# Patient Record
Sex: Female | Born: 1995 | Race: Black or African American | Hispanic: No | Marital: Single | Smoking: Never smoker
Health system: Southern US, Community
[De-identification: ages and names within clinical notes are randomized; demographics above are authoritative.]

## PROBLEM LIST (undated history)

## (undated) ENCOUNTER — Inpatient Hospital Stay (HOSPITAL_COMMUNITY): Payer: Self-pay

## (undated) DIAGNOSIS — A64 Unspecified sexually transmitted disease: Secondary | ICD-10-CM

## (undated) DIAGNOSIS — N926 Irregular menstruation, unspecified: Secondary | ICD-10-CM

## (undated) DIAGNOSIS — Z8619 Personal history of other infectious and parasitic diseases: Secondary | ICD-10-CM

## (undated) HISTORY — DX: Personal history of other infectious and parasitic diseases: Z86.19

## (undated) HISTORY — PX: NO PAST SURGERIES: SHX2092

## (undated) HISTORY — DX: Irregular menstruation, unspecified: N92.6

---
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
## 2014-01-31 ENCOUNTER — Encounter (HOSPITAL_COMMUNITY): Payer: Self-pay | Admitting: Emergency Medicine

## 2014-01-31 ENCOUNTER — Emergency Department (HOSPITAL_COMMUNITY)
Admission: EM | Admit: 2014-01-31 | Discharge: 2014-02-01 | Disposition: A | Discharge status: Home / Self Care | Payer: No Typology Code available for payment source | Attending: Emergency Medicine | Admitting: Emergency Medicine

## 2014-01-31 DIAGNOSIS — Z8742 Personal history of other diseases of the female genital tract: Secondary | ICD-10-CM | POA: Insufficient documentation

## 2014-01-31 DIAGNOSIS — Z8619 Personal history of other infectious and parasitic diseases: Secondary | ICD-10-CM | POA: Insufficient documentation

## 2014-01-31 DIAGNOSIS — R21 Rash and other nonspecific skin eruption: Secondary | ICD-10-CM | POA: Insufficient documentation

## 2014-01-31 NOTE — ED Notes (Signed)
Pt. reports itchy rashes at abdomen /back and arms today , respirations unlabored/airway intact .

## 2014-02-01 MED ORDER — FAMOTIDINE 20 MG PO TABS
20.0000 mg | ORAL_TABLET | Freq: Once | ORAL | Status: AC
  Administered 2014-02-01: 20 mg via ORAL
  Filled 2014-02-01: qty 1

## 2014-02-01 MED ORDER — PERMETHRIN 5 % EX CREA: TOPICAL_CREAM | CUTANEOUS | Status: DC

## 2014-02-01 MED ORDER — HYDROCORTISONE 1 % EX CREA: 1.0000 "application " | TOPICAL_CREAM | Freq: Two times a day (BID) | CUTANEOUS | Status: DC

## 2014-02-01 MED ORDER — HYDROXYZINE HCL 25 MG PO TABS: 25.0000 mg | ORAL_TABLET | Freq: Four times a day (QID) | ORAL | Status: DC

## 2014-02-01 MED ORDER — FAMOTIDINE 20 MG PO TABS: 20.0000 mg | ORAL_TABLET | Freq: Two times a day (BID) | ORAL | Status: DC

## 2014-02-01 NOTE — ED Provider Notes (Signed)
CSN: [id number]     Arrival date & time 01/31/14  2134 History   First MD Initiated Contact with Patient 01/31/14 2358     Chief Complaint  Patient presents with  . Rash     (Consider location/radiation/quality/duration/timing/severity/associated sxs/prior Treatment) HPI Comments: Patient is a 18 year old female who presents today itchy rash onset today. She reports that the rash is diffusely over her body, but spares her neck and face. No one else has this rash. The rash is worse at night. She did change her detergent on Saturday. No new medicines or pets. The patient has not taken any medication prior to arrival to improve her symptoms. She denies shortness of breath, sensation that her throat is closing, nausea, vomiting.   The history is provided by the patient. No language interpreter was used.    Past Medical History  Diagnosis Date  . H/O varicella   . H/O measles   . Irregular periods/menstrual cycles    Past Surgical History  Procedure Laterality Date  . No past surgeries     Family History  Problem Relation Age of Onset  . Cancer Paternal Grandfather    History  Substance Use Topics  . Smoking status: Never Smoker   . Smokeless tobacco: Not on file  . Alcohol Use: No   OB History   Grav Para Term Preterm Abortions TAB SAB Ect Mult Living   1 1 1       1      Review of Systems  Constitutional: Negative for fever and chills.  HENT: Negative for trouble swallowing.   Respiratory: Negative for shortness of breath.   Cardiovascular: Negative for chest pain.  Gastrointestinal: Negative for nausea and vomiting.  Skin: Positive for rash.  All other systems reviewed and are negative.     Allergies  Review of patient's allergies indicates no known allergies.  Home Medications   Prior to Admission medications   Not on File   BP 109/77  Pulse 74  Temp(Src) 98.5 F (36.9 C) (Oral)  Resp 18  SpO2 99%  LMP 01/23/2014 Physical Exam  Nursing note and vitals  reviewed. Constitutional: She is oriented to person, place, and time. She appears well-developed and well-nourished. She does not appear ill. No distress.  HENT:  Head: Normocephalic and atraumatic.  Right Ear: External ear normal.  Left Ear: External ear normal.  Nose: Nose normal.  Mouth/Throat: Uvula is midline and oropharynx is clear and moist. No uvula swelling.  Easily maintaining own secretions   Eyes: Conjunctivae are normal.  Neck: Normal range of motion.  Cardiovascular: Normal rate, regular rhythm and normal heart sounds.   Pulmonary/Chest: Effort normal and breath sounds normal. No stridor. No respiratory distress. She has no wheezes. She has no rales.  Speaking in full sentences  Abdominal: Soft. She exhibits no distension.  Musculoskeletal: Normal range of motion.  Neurological: She is alert and oriented to person, place, and time. She has normal strength.  Skin: Skin is warm and dry. She is not diaphoretic. No erythema.  Papular rash over torso, back, and arms bilaterally. No erythema. No pustules. No blisters.  Psychiatric: She has a normal mood and affect. Her behavior is normal.    ED Course  Procedures (including critical care time) Labs Review Labs Reviewed - No data to display  Imaging Review No results found.   EKG Interpretation None      MDM   Final diagnoses:  Rash   No evidence of SJS or necrotizing fasciitis.  Do not suspect pemphigus vulgaris. No blisters, no pustules, no warmth, no draining sinus tracts, no superficial abscesses, no bullous impetigo, no vesicles, no desquamation, no target lesions with dusky purpura or a central bulla. Not tender to touch. Some concern for scabies. I instructed patient on Elimite cream, washing all sheets and clothes with hot water, and Nix spray for couches. Also discussed changing detergent as she recently switched, although this does not appear allergic. Discussed reasons to return to ED. Vital signs stable for  discharge. Patient / Family / Caregiver informed of clinical course, understand medical decision-making process, and agree with plan.      Mora Bellman, PA-C 02/01/14 1325

## 2014-02-01 NOTE — ED Provider Notes (Signed)
Medical screening examination/treatment/procedure(s) were performed by non-physician practitioner and as supervising physician I was immediately available for consultation/collaboration.   EKG Interpretation None        Junius ArgyleForrest S Adora Yeh, MD 02/01/14 2020

## 2014-02-01 NOTE — Discharge Instructions (Signed)
Rash A rash is a change in the color or feel of your skin. There are many different types of rashes. You may have other problems along with your rash. HOME CARE  Avoid the thing that caused your rash.  Do not scratch your rash.  You may take cools baths to help stop itching.  Only take medicines as told by your doctor.  Keep all doctor visits as told. GET HELP RIGHT AWAY IF:   Your pain, puffiness (swelling), or redness gets worse.  You have a fever.  You have new or severe problems.  You have body aches, watery poop (diarrhea), or you throw up (vomit).  Your rash is not better after 3 days. MAKE SURE YOU:   Understand these instructions.  Will watch your condition.  Will get help right away if you are not doing well or get worse. Document Released: 12/30/2007 Document Revised: 10/05/2011 Document Reviewed: 04/27/2011 Exit[employer] Patient Information 2015 [employer]. This information is not intended to replace advice given to you by your health care provider. Make sure you discuss any questions you have with your health care provider.  

## 2014-05-28 ENCOUNTER — Encounter (HOSPITAL_COMMUNITY): Payer: Self-pay | Admitting: Emergency Medicine

## 2015-02-04 ENCOUNTER — Encounter (HOSPITAL_COMMUNITY): Payer: Self-pay | Admitting: Emergency Medicine

## 2015-02-04 ENCOUNTER — Emergency Department (HOSPITAL_COMMUNITY)
Admission: EM | Admit: 2015-02-04 | Discharge: 2015-02-04 | Disposition: A | Discharge status: Home / Self Care | Payer: Self-pay | Attending: Emergency Medicine | Admitting: Emergency Medicine

## 2015-02-04 DIAGNOSIS — Z79899 Other long term (current) drug therapy: Secondary | ICD-10-CM | POA: Insufficient documentation

## 2015-02-04 DIAGNOSIS — X58XXXA Exposure to other specified factors, initial encounter: Secondary | ICD-10-CM | POA: Insufficient documentation

## 2015-02-04 DIAGNOSIS — Y939 Activity, unspecified: Secondary | ICD-10-CM | POA: Insufficient documentation

## 2015-02-04 DIAGNOSIS — Y999 Unspecified external cause status: Secondary | ICD-10-CM | POA: Insufficient documentation

## 2015-02-04 DIAGNOSIS — Z8619 Personal history of other infectious and parasitic diseases: Secondary | ICD-10-CM | POA: Insufficient documentation

## 2015-02-04 DIAGNOSIS — W57XXXA Bitten or stung by nonvenomous insect and other nonvenomous arthropods, initial encounter: Secondary | ICD-10-CM

## 2015-02-04 DIAGNOSIS — Z8742 Personal history of other diseases of the female genital tract: Secondary | ICD-10-CM | POA: Insufficient documentation

## 2015-02-04 DIAGNOSIS — Y929 Unspecified place or not applicable: Secondary | ICD-10-CM | POA: Insufficient documentation

## 2015-02-04 DIAGNOSIS — S70361A Insect bite (nonvenomous), right thigh, initial encounter: Secondary | ICD-10-CM | POA: Insufficient documentation

## 2015-02-04 DIAGNOSIS — Z7952 Long term (current) use of systemic steroids: Secondary | ICD-10-CM | POA: Insufficient documentation

## 2015-02-04 MED ORDER — CEPHALEXIN 500 MG PO CAPS: 500.0000 mg | ORAL_CAPSULE | Freq: Four times a day (QID) | ORAL | Status: DC

## 2015-02-04 MED ORDER — IBUPROFEN 800 MG PO TABS: 800.0000 mg | ORAL_TABLET | Freq: Three times a day (TID) | ORAL | Status: DC

## 2015-02-04 NOTE — ED Provider Notes (Signed)
CSN: [id number]     Arrival date & time 02/04/15  2027 History  This chart was scribed for non-physician practitioner, Elpidio AnisShari Debbi Strandberg, PA-C, working with Elwin MochaBlair Walden, MD by Evon Slackerrance Branch, ED Scribe. This patient was seen in room WTR7/WTR7 and the patient's care was started at 8:39 PM.    Chief Complaint  Patient presents with  . Skin Problem   The history is provided by the patient. No language interpreter was used.   HPI Comments: Regina SidleShanna M Reeves is a 19 y.o. female who presents to the Emergency Department complaining of possible insect bite on her right thigh onset 2 days prior. Pt rates the severity of her pain 7/10. Pt states that she has associated swelling in the area. Pt states that she has tried alcohol, warm compresses and ice packs with no relief. Pt denies any medications PTA. PT doesn't report fever or drainage.   Past Medical History  Diagnosis Date  . H/O varicella   . H/O measles   . Irregular periods/menstrual cycles    Past Surgical History  Procedure Laterality Date  . No past surgeries     Family History  Problem Relation Age of Onset  . Cancer Paternal Grandfather    History  Substance Use Topics  . Smoking status: Never Smoker   . Smokeless tobacco: Not on file  . Alcohol Use: No   OB History    Gravida Para Term Preterm AB TAB SAB Ectopic Multiple Living   1 1 1       1       Review of Systems  Constitutional: Negative for fever.  Skin: Positive for wound.  All other systems reviewed and are negative.    Allergies  Review of patient's allergies indicates no known allergies.  Home Medications   Prior to Admission medications   Medication Sig Start Date End Date Taking? Authorizing Provider  famotidine (PEPCID) 20 MG tablet Take 1 tablet (20 mg total) by mouth 2 (two) times daily. 02/01/14   Junious SilkHannah Merrell, PA-C  hydrocortisone cream 1 % Apply 1 application topically 2 (two) times daily. 02/01/14   Junious SilkHannah Merrell, PA-C  hydrOXYzine  (ATARAX/VISTARIL) 25 MG tablet Take 1 tablet (25 mg total) by mouth every 6 (six) hours. 02/01/14   Junious SilkHannah Merrell, PA-C  permethrin (ELIMITE) 5 % cream Apply to entire body other than face - let sit for 14 hours then wash off, may repeat in 1 week if still having symptoms 02/01/14   Junious SilkHannah Merrell, PA-C   BP 116/71 mmHg  Pulse 92  Temp(Src) 99.3 F (37.4 C) (Oral)  Resp 16  SpO2 98%   Physical Exam  Constitutional: She is oriented to person, place, and time. She appears well-developed and well-nourished. No distress.  HENT:  Head: Normocephalic and atraumatic.  Eyes: Conjunctivae and EOM are normal.  Neck: Neck supple. No tracheal deviation present.  Cardiovascular: Normal rate.   Pulmonary/Chest: Effort normal. No respiratory distress.  Musculoskeletal: Normal range of motion.  Neurological: She is alert and oriented to person, place, and time.  Skin: Skin is warm and dry.  Right medial thigh small pustular ulceration without induration or fluctuance well marketed area of swelling that extends 3 cm in diameter, no redness or warmth moderately tender.   Psychiatric: She has a normal mood and affect. Her behavior is normal.  Nursing note and vitals reviewed.   ED Course  Procedures (including critical care time) DIAGNOSTIC STUDIES: Oxygen Saturation is 98% on RA, normal by my interpretation.  COORDINATION OF CARE: 9:04 PM-Discussed treatment plan with pt at bedside and pt agreed to plan.     Labs Review Labs Reviewed - No data to display  Imaging Review No results found.   EKG Interpretation None      MDM   Final diagnoses:  None   1. Insect bite  Feel symptoms are related to insect bite vs abscess as there is no induration, fluctuance and there is clearly defined inflammation surround the central lesion. Instructed to return in 2 days for recheck. Will cover with abx, warm compresses. Return precautions discussed that should prompt recheck prior to the 2  days.  I personally performed the services described in this documentation, which was scribed in my presence. The recorded information has been reviewed and is accurate.       Elpidio Anis, PA-C 02/04/15 2131  Elwin Mocha, MD 02/04/15 [phone]

## 2015-02-04 NOTE — Discharge Instructions (Signed)
Heat Therapy Heat therapy can help ease sore, stiff, injured, and tight muscles and joints. Heat relaxes your muscles, which may help ease your pain.  RISKS AND COMPLICATIONS If you have any of the following conditions, do not use heat therapy unless your health care provider has approved:  Poor circulation.  Healing wounds or scarred skin in the area being treated.  Diabetes, heart disease, or high blood pressure.  Not being able to feel (numbness) the area being treated.  Unusual swelling of the area being treated.  Active infections.  Blood clots.  Cancer.  Inability to communicate pain. This may include young children and people who have problems with their brain function (dementia).  Pregnancy. Heat therapy should only be used on old, pre-existing, or long-lasting (chronic) injuries. Do not use heat therapy on new injuries unless directed by your health care provider. HOW TO USE HEAT THERAPY There are several different kinds of heat therapy, including:  Moist heat pack.  Warm water bath.  Hot water bottle.  Electric heating pad.  Heated gel pack.  Heated wrap.  Electric heating pad. Use the heat therapy method suggested by your health care provider. Follow your health care provider's instructions on when and how to use heat therapy. GENERAL HEAT THERAPY RECOMMENDATIONS  Do not sleep while using heat therapy. Only use heat therapy while you are awake.  Your skin may turn pink while using heat therapy. Do not use heat therapy if your skin turns red.  Do not use heat therapy if you have new pain.  High heat or long exposure to heat can cause burns. Be careful when using heat therapy to avoid burning your skin.  Do not use heat therapy on areas of your skin that are already irritated, such as with a rash or sunburn. SEEK MEDICAL CARE IF:  You have blisters, redness, swelling, or numbness.  You have new pain.  Your pain is worse. MAKE SURE  YOU:  Understand these instructions.  Will watch your condition.  Will get help right away if you are not doing well or get worse. Document Released: 10/05/2011 Document Revised: 11/27/2013 Document Reviewed: 09/05/2013 [facility] Patient Information 2015 [place]. This information is not intended to replace advice given to you by your health care provider. Make sure you discuss any questions you have with your health care provider. Insect Bite Mosquitoes, flies, fleas, bedbugs, and many other insects can bite. Insect bites are different from insect stings. A sting is when venom is injected into the skin. Some insect bites can transmit infectious diseases. SYMPTOMS  Insect bites usually turn red, swell, and itch for 2 to 4 days. They often go away on their own. TREATMENT  Your caregiver may prescribe antibiotic medicines if a bacterial infection develops in the bite. HOME CARE INSTRUCTIONS  Do not scratch the bite area.  Keep the bite area clean and dry. Wash the bite area thoroughly with soap and water.  Put ice or cool compresses on the bite area.  Put ice in a plastic bag.  Place a towel between your skin and the bag.  Leave the ice on for 20 minutes, 4 times a day for the first 2 to 3 days, or as directed.  You may apply a baking soda paste, cortisone cream, or calamine lotion to the bite area as directed by your caregiver. This can help reduce itching and swelling.  Only take over-the-counter or prescription medicines as directed by your caregiver.  If you are given antibiotics,  take them as directed. Finish them even if you start to feel better. °You may need a tetanus shot if: °· You cannot remember when you had your last tetanus shot. °· You have never had a tetanus shot. °· The injury broke your skin. °If you get a tetanus shot, your arm may swell, get red, and feel warm to the touch. This is common and not a problem. If you need a tetanus shot and you choose not to  have one, there is a rare chance of getting tetanus. Sickness from tetanus can be serious. °SEEK IMMEDIATE MEDICAL CARE IF:  °· You have increased pain, redness, or swelling in the bite area. °· You see a red line on the skin coming from the bite. °· You have a fever. °· You have joint pain. °· You have a headache or neck pain. °· You have unusual weakness. °· You have a rash. °· You have chest pain or shortness of breath. °· You have abdominal pain, nausea, or vomiting. °· You feel unusually tired or sleepy. °MAKE SURE YOU:  °· Understand these instructions. °· Will watch your condition. °· Will get help right away if you are not doing well or get worse. °Document Released: 08/20/2004 Document Revised: 10/05/2011 Document Reviewed: 02/11/2011 °[employer]® Patient Information ©2015 [employer]. This information is not intended to replace advice given to you by your health care provider. Make sure you discuss any questions you have with your health care provider. ° °
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
## 2015-03-08 ENCOUNTER — Encounter (HOSPITAL_COMMUNITY): Payer: Self-pay | Admitting: Emergency Medicine

## 2015-03-08 ENCOUNTER — Emergency Department (HOSPITAL_COMMUNITY)
Admission: EM | Admit: 2015-03-08 | Discharge: 2015-03-09 | Discharge status: Against Medical Advice | Payer: No Typology Code available for payment source | Attending: Emergency Medicine | Admitting: Emergency Medicine

## 2015-03-08 DIAGNOSIS — S8992XA Unspecified injury of left lower leg, initial encounter: Secondary | ICD-10-CM | POA: Insufficient documentation

## 2015-03-08 DIAGNOSIS — S3992XA Unspecified injury of lower back, initial encounter: Secondary | ICD-10-CM | POA: Diagnosis present

## 2015-03-08 DIAGNOSIS — Y998 Other external cause status: Secondary | ICD-10-CM | POA: Insufficient documentation

## 2015-03-08 DIAGNOSIS — Y92410 Unspecified street and highway as the place of occurrence of the external cause: Secondary | ICD-10-CM | POA: Insufficient documentation

## 2015-03-08 DIAGNOSIS — Y9389 Activity, other specified: Secondary | ICD-10-CM | POA: Insufficient documentation

## 2015-03-08 DIAGNOSIS — S199XXA Unspecified injury of neck, initial encounter: Secondary | ICD-10-CM | POA: Diagnosis not present
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
## 2015-03-08 NOTE — ED Notes (Signed)
Pt called to TR 5, patient not present in lobby.

## 2015-03-09 NOTE — ED Notes (Signed)
Pt not in lobby.  

## 2015-03-10 ENCOUNTER — Encounter (HOSPITAL_COMMUNITY): Payer: Self-pay | Admitting: Emergency Medicine

## 2015-03-10 ENCOUNTER — Emergency Department (HOSPITAL_COMMUNITY): Payer: No Typology Code available for payment source

## 2015-03-10 ENCOUNTER — Emergency Department (HOSPITAL_COMMUNITY)
Admission: EM | Admit: 2015-03-10 | Discharge: 2015-03-11 | Disposition: A | Discharge status: Home / Self Care | Payer: No Typology Code available for payment source | Attending: Emergency Medicine | Admitting: Emergency Medicine

## 2015-03-10 DIAGNOSIS — S79912A Unspecified injury of left hip, initial encounter: Secondary | ICD-10-CM | POA: Insufficient documentation

## 2015-03-10 DIAGNOSIS — S3991XA Unspecified injury of abdomen, initial encounter: Secondary | ICD-10-CM | POA: Diagnosis not present

## 2015-03-10 DIAGNOSIS — Y9389 Activity, other specified: Secondary | ICD-10-CM | POA: Insufficient documentation

## 2015-03-10 DIAGNOSIS — Z8742 Personal history of other diseases of the female genital tract: Secondary | ICD-10-CM | POA: Diagnosis not present

## 2015-03-10 DIAGNOSIS — Y998 Other external cause status: Secondary | ICD-10-CM | POA: Insufficient documentation

## 2015-03-10 DIAGNOSIS — M25552 Pain in left hip: Secondary | ICD-10-CM

## 2015-03-10 DIAGNOSIS — Z3202 Encounter for pregnancy test, result negative: Secondary | ICD-10-CM | POA: Insufficient documentation

## 2015-03-10 DIAGNOSIS — Z8619 Personal history of other infectious and parasitic diseases: Secondary | ICD-10-CM | POA: Insufficient documentation

## 2015-03-10 DIAGNOSIS — Y92410 Unspecified street and highway as the place of occurrence of the external cause: Secondary | ICD-10-CM | POA: Insufficient documentation

## 2015-03-10 LAB — POC URINE PREG, ED: Preg Test, Ur: NEGATIVE
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
## 2015-03-11 MED ORDER — IBUPROFEN 800 MG PO TABS: 800.0000 mg | ORAL_TABLET | Freq: Three times a day (TID) | ORAL | Status: DC
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
## 2016-08-03 ENCOUNTER — Encounter: Payer: Self-pay | Admitting: Obstetrics and Gynecology

## 2016-08-03 ENCOUNTER — Ambulatory Visit (INDEPENDENT_AMBULATORY_CARE_PROVIDER_SITE_OTHER): Payer: Medicaid Other | Admitting: Obstetrics and Gynecology

## 2016-08-03 VITALS — BP 114/76 | HR 69 | Temp 99.5°F | Ht 63.0 in | Wt 173.0 lb

## 2016-08-03 DIAGNOSIS — Z3169 Encounter for other general counseling and advice on procreation: Secondary | ICD-10-CM

## 2016-08-03 NOTE — Progress Notes (Signed)
21 yo G1P1 with LMP 07/31/2016 presenting today for the evaluation of infertility. Patient has been on depo-provera for 3 years following the birth of her son in 2012. She reports her last dose of depo-provera in 2016. She was amenorrheic for a year but has had a year or regular monthly menses lasting 5 days. She reports her periods as heavy without passage of clots. She denies dysmenorrhea. Patient reports being able to conceive without issues. Her current partner has never fathered any children.  Past Medical History:  Diagnosis Date  . H/O measles   . H/O varicella   . Irregular periods/menstrual cycles    Past Surgical History:  Procedure Laterality Date  . NO PAST SURGERIES     Family History  Problem Relation Age of Onset  . Cancer Paternal Grandfather    Social History  Substance Use Topics  . Smoking status: Never Smoker  . Smokeless tobacco: Not on file  . Alcohol use No   ROS See pertinent in HPI  Blood pressure 114/76, pulse 69, temperature 99.5 F (37.5 C), temperature source Oral, height 5\' 3"  (1.6 m), weight 173 lb (78.5 kg), last menstrual period 07/31/2016. GENERAL: Well-developed, well-nourished female in no acute distress.  NEURO: alert and oriented x 3  A/P 21 yo who is seeking pregnancy - Advised to start taking prenatal vitamins now - reviewed timing of intercourse around the time of ovulation with the patient - Patient is due for her pap smear but is currently on her period. She will reschedule that appointment - RTC in 6 months if still unable to conceive

## 2016-08-03 NOTE — Progress Notes (Signed)
Patient states that patient last had depo injection in 2016 and has been sexually active and has not been able to get pregnant.

## 2016-08-17 ENCOUNTER — Encounter: Payer: Self-pay | Admitting: Obstetrics and Gynecology

## 2016-08-17 ENCOUNTER — Ambulatory Visit (INDEPENDENT_AMBULATORY_CARE_PROVIDER_SITE_OTHER): Payer: Medicaid Other | Admitting: Obstetrics and Gynecology

## 2016-08-17 ENCOUNTER — Other Ambulatory Visit (HOSPITAL_COMMUNITY)
Admission: RE | Admit: 2016-08-17 | Discharge: 2016-08-17 | Disposition: A | Discharge status: Home / Self Care | Payer: Medicaid Other | Source: Ambulatory Visit | Attending: Obstetrics and Gynecology | Admitting: Obstetrics and Gynecology

## 2016-08-17 DIAGNOSIS — Z202 Contact with and (suspected) exposure to infections with a predominantly sexual mode of transmission: Secondary | ICD-10-CM | POA: Insufficient documentation

## 2016-08-17 DIAGNOSIS — Z01419 Encounter for gynecological examination (general) (routine) without abnormal findings: Secondary | ICD-10-CM | POA: Diagnosis not present

## 2016-08-17 DIAGNOSIS — Z0000 Encounter for general adult medical examination without abnormal findings: Secondary | ICD-10-CM

## 2016-08-17 DIAGNOSIS — Z113 Encounter for screening for infections with a predominantly sexual mode of transmission: Secondary | ICD-10-CM | POA: Diagnosis present

## 2016-08-17 NOTE — Progress Notes (Signed)
Subjective:     Regina Reeves is a 21 y.o. female here for a routine exam. She has no GYN complaints. Last pap smear was over 6618yrs ago. H/O chlamydia last year. Sexual active without contraception. Desires pregnancy.  Cycles are regular monthly   Gynecologic History Patient's last menstrual period was 07/31/2016 (exact date). Contraception: none Last Pap: 2013. Results were: normal Last mammogram: NA. Results were: NA  Obstetric History OB History  Gravida Para Term Preterm AB Living  1 1 1     1   SAB TAB Ectopic Multiple Live Births          1    # Outcome Date GA Lbr Len/2nd Weight Sex Delivery Anes PTL Lv  1 Term 05/26/11 [id number] 08:37 / 02:20 7 lb 8.8 oz (3.425 kg) M Vag-Spont EPI  LIV     Birth Comments: None       The following portions of the patient's history were reviewed and updated as appropriate: allergies, current medications, past family history, past medical history, past social history and past surgical history.  Review of Systems Pertinent items are noted in HPI.    Objective:    BP 120/89 (BP Location: Right Arm)   Pulse 74   Wt 174 lb (78.9 kg)   LMP 07/31/2016 (Exact Date)   BMI 30.82 kg/m   General Appearance:    Alert, cooperative, no distress, appears stated age  Head:    Normocephalic, without obvious abnormality, atraumatic  Eyes:    PERRL, conjunctiva/corneas clear, EOM's intact, fundi    benign, both eyes  Ears:    Normal TM's and external ear canals, both ears  Nose:   Nares normal, septum midline, mucosa normal, no drainage    or sinus tenderness  Throat:   Lips, mucosa, and tongue normal; teeth and gums normal  Neck:   Supple, symmetrical, trachea midline, no adenopathy;    thyroid:  no enlargement/tenderness/nodules; no carotid   bruit or JVD  Back:     Symmetric, no curvature, ROM normal, no CVA tenderness  Lungs:     Clear to auscultation bilaterally, respirations unlabored  Chest Wall:    No tenderness or deformity   Heart:     Regular rate and rhythm, S1 and S2 normal, no murmur, rub   or gallop  Breast Exam:    No tenderness, masses, or nipple abnormality  Abdomen:     Soft, non-tender, bowel sounds active all four quadrants,    no masses, no organomegaly  Genitalia:    Normal female without lesion, discharge or tenderness  Rectal:    Normal tone, normal prostate, no masses or tenderness;   guaiac negative stool  Extremities:   Extremities normal, atraumatic, no cyanosis or edema  Pulses:   2+ and symmetric all extremities  Skin:   Skin color, texture, turgor normal, no rashes or lesions  Lymph nodes:   Cervical, supraclavicular, and axillary nodes normal  Neurologic:   CNII-XII intact, normal strength, sensation and reflexes    throughout      Assessment:    Healthy female exam.   STD exposure Plan:   Pap smear and STD testing completed today. Will contact pt with results. Encourage healthy diet, exercise and qd PNV.

## 2016-08-18 LAB — GC/CHLAMYDIA PROBE AMP (~~LOC~~) NOT AT ARMC
CHLAMYDIA, DNA PROBE: NEGATIVE
Neisseria Gonorrhea: NEGATIVE

## 2016-08-18 LAB — HIV ANTIBODY (ROUTINE TESTING W REFLEX): HIV Screen 4th Generation wRfx: NONREACTIVE

## 2016-08-18 LAB — RPR: RPR Ser Ql: NONREACTIVE

## 2016-08-19 LAB — CYTOLOGY - PAP
ADEQUACY: ABSENT
Chlamydia: NEGATIVE
DIAGNOSIS: NEGATIVE
Neisseria Gonorrhea: NEGATIVE

## 2016-08-24 ENCOUNTER — Encounter: Payer: Self-pay | Admitting: *Deleted

## 2016-11-16 ENCOUNTER — Emergency Department
Admission: EM | Admit: 2016-11-16 | Discharge: 2016-11-16 | Disposition: A | Discharge status: Home / Self Care | Payer: No Typology Code available for payment source | Attending: Emergency Medicine | Admitting: Emergency Medicine

## 2016-11-16 DIAGNOSIS — Y9389 Activity, other specified: Secondary | ICD-10-CM | POA: Diagnosis not present

## 2016-11-16 DIAGNOSIS — Y999 Unspecified external cause status: Secondary | ICD-10-CM | POA: Insufficient documentation

## 2016-11-16 DIAGNOSIS — S161XXA Strain of muscle, fascia and tendon at neck level, initial encounter: Secondary | ICD-10-CM | POA: Insufficient documentation

## 2016-11-16 DIAGNOSIS — Y92410 Unspecified street and highway as the place of occurrence of the external cause: Secondary | ICD-10-CM | POA: Insufficient documentation

## 2016-11-16 DIAGNOSIS — S199XXA Unspecified injury of neck, initial encounter: Secondary | ICD-10-CM | POA: Diagnosis present

## 2016-11-16 MED ORDER — CYCLOBENZAPRINE HCL 10 MG PO TABS: 10.0000 mg | ORAL_TABLET | Freq: Three times a day (TID) | ORAL | 0 refills | Status: DC | PRN

## 2016-11-16 MED ORDER — NAPROXEN 500 MG PO TABS: 500.0000 mg | ORAL_TABLET | Freq: Two times a day (BID) | ORAL | 0 refills | Status: DC
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
## 2016-11-16 NOTE — Discharge Instructions (Signed)
Follow up with the primary care provider of your choice for symptoms that are not improving over the next few days.  Apply ice about 20 minutes per hour until soreness is resolving.
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
## 2017-05-23 ENCOUNTER — Emergency Department (HOSPITAL_COMMUNITY)
Admission: EM | Admit: 2017-05-23 | Discharge: 2017-05-23 | Disposition: A | Discharge status: Home / Self Care | Payer: Medicaid Other | Attending: Emergency Medicine | Admitting: Emergency Medicine

## 2017-05-23 ENCOUNTER — Emergency Department (HOSPITAL_COMMUNITY): Payer: Medicaid Other

## 2017-05-23 ENCOUNTER — Encounter (HOSPITAL_COMMUNITY): Payer: Self-pay | Admitting: *Deleted

## 2017-05-23 DIAGNOSIS — J069 Acute upper respiratory infection, unspecified: Secondary | ICD-10-CM | POA: Diagnosis not present

## 2017-05-23 DIAGNOSIS — Z79899 Other long term (current) drug therapy: Secondary | ICD-10-CM | POA: Diagnosis not present

## 2017-05-23 DIAGNOSIS — M940 Chondrocostal junction syndrome [Tietze]: Secondary | ICD-10-CM

## 2017-05-23 DIAGNOSIS — J020 Streptococcal pharyngitis: Secondary | ICD-10-CM | POA: Diagnosis not present

## 2017-05-23 DIAGNOSIS — B001 Herpesviral vesicular dermatitis: Secondary | ICD-10-CM | POA: Diagnosis not present

## 2017-05-23 DIAGNOSIS — K1370 Unspecified lesions of oral mucosa: Secondary | ICD-10-CM | POA: Diagnosis present

## 2017-05-23 DIAGNOSIS — L0100 Impetigo, unspecified: Secondary | ICD-10-CM

## 2017-05-23 HISTORY — DX: Unspecified sexually transmitted disease: A64

## 2017-05-23 LAB — RAPID STREP SCREEN (MED CTR MEBANE ONLY): STREPTOCOCCUS, GROUP A SCREEN (DIRECT): POSITIVE — AB

## 2017-05-23 MED ORDER — MUPIROCIN 2 % EX OINT: 1.0000 "application " | TOPICAL_OINTMENT | Freq: Three times a day (TID) | CUTANEOUS | 0 refills | Status: AC

## 2017-05-23 MED ORDER — PENICILLIN G BENZATHINE & PROC 1200000 UNIT/2ML IM SUSP
1.2000 10*6.[IU] | Freq: Once | INTRAMUSCULAR | Status: AC
  Administered 2017-05-23: 1.2 10*6.[IU] via INTRAMUSCULAR
  Filled 2017-05-23: qty 2

## 2017-05-23 MED ORDER — VALACYCLOVIR HCL 1 G PO TABS: 1000.0000 mg | ORAL_TABLET | Freq: Two times a day (BID) | ORAL | 0 refills | Status: AC

## 2017-05-23 NOTE — Discharge Instructions (Signed)
You have been treated for strep throat today, so your sore throat should start to improve in the next few days. Continue to stay well-hydrated. Gargle warm salt water and spit it out. Use chloraseptic spray as needed for sore throat. Use valtrex as directed, and use mupirocin cream as directed, to help with the sore on your lip. Avoid scratching the area or opening the scab. Continue to alternate between Tylenol and Ibuprofen for pain or fever. Use Mucinex for cough suppression/expectoration of mucus. Use netipot and flonase to help with nasal congestion. May consider over-the-counter Benadryl or other antihistamine to decrease secretions and for help with your symptoms. Follow up with your primary care doctor in 5-7 days for recheck of ongoing symptoms. Return to emergency department for emergent changing or worsening of symptoms.

## 2017-05-23 NOTE — ED Triage Notes (Signed)
Pt c/o of lesion on bottom lip that has been there x1 week.  Pt sts she had an STD in 2015.  Pt has put abreva on lip x1 week.  Pt is sexually active and does not use protection or birth control.  Pt sts she doesn't know if shes pregnant or not.  Pt sts shes starting to also get cold symptoms.  C/o chest and throat pain.  Denies fever.

## 2017-05-23 NOTE — ED Provider Notes (Signed)
[facility]-EMERGENCY DEPT Provider Note   CSN: [id number] Arrival date & time: 05/23/17  2006     History   Chief Complaint Chief Complaint  Patient presents with  . Mouth Lesions    HPI Regina Reeves is a 21 y.o. female with a PMHx of chlamydia in 2017 and irregular menstrual cycles, who presents to the ED with multiple complaints. Her primary complaint is a lower lip lesion that again 1 week ago as a small bump/blister, and then opened up and became macerated, crusted over, and mildly swollen and tender. She describes the pain is 8/10 intermittent nonradiating burning/irritated pain to the lower lip lesion, worse with palpation of the area, unrelieved with topical alcohol, apple cider vinegar, Abreva, and peroxide, and mildly improved with placing a hot rag over top of the area. She states that every time that she opens her mouth or moves her lip, the area seems to crack open and then crusts over again. She has not come into contact with anybody with a similar lesion. She has no other oral lesions, no tongue or face swelling, and she denies any drainage, erythema, red streaking, or warmth from the area. She reports that she went to the health department on 05/17/17 for "a full check up" and had STD testing which was negative. Of note, chart review reveals she was seen by an OBGYN on 08/17/16 and had HIV/RPR/GC/CT testing which was negative; pap smear at that time also negative.   Her second complaint is sore throat, dry cough, rhinorrhea, and anterior chest soreness with coughing that began today. No known sick contacts. She is a nonsmoker.   She denies any drooling, trismus, ear pain or drainage, wheezing, fevers, chills, SOB, LE swelling, recent travel/surgery/immobilization, estrogen use, personal/family hx of DVT/PE, abd pain, N/V/D/C, hematuria, dysuria, vaginal bleeding/discharge, myalgias, arthralgias, claudication, orthopnea, numbness, tingling, focal weakness,  or any other complaints at this time. No known FHx of cardiac disease. LMP 05/03/17.    The history is provided by the patient and medical records. No language interpreter was used.  Mouth Lesions  Location:  Lower lip Lower lip location: outer, central. Quality:  Blistered, crusty and painful Pain details:    Quality:  Burning   Severity:  Moderate   Duration:  1 week   Timing:  Intermittent   Progression:  Unchanged Onset quality:  Gradual Severity:  Mild Duration:  1 week Progression:  Unchanged Chronicity:  New Relieved by: warm rag. Exacerbated by: touching area. Ineffective treatments:  Topical medications and topical solutions Associated symptoms: rhinorrhea and sore throat   Associated symptoms: no ear pain and no fever     Past Medical History:  Diagnosis Date  . H/O measles   . H/O varicella   . Irregular periods/menstrual cycles   . STD (sexually transmitted disease) 2015   CG    Patient Active Problem List   Diagnosis Date Noted  . Visit for routine gyn exam 08/17/2016  . Possible exposure to STD 08/17/2016    Past Surgical History:  Procedure Laterality Date  . NO PAST SURGERIES      OB History    Gravida Para Term Preterm AB Living   1 1 1     1    SAB TAB Ectopic Multiple Live Births           1       Home Medications    Prior to Admission medications   Medication Sig Start Date End Date Taking?  Authorizing Provider  acetaminophen (TYLENOL) 325 MG tablet Take 650 mg by mouth every 6 (six) hours as needed for moderate pain.    [provider]  cyclobenzaprine (FLEXERIL) 10 MG tablet Take 1 tablet (10 mg total) by mouth 3 (three) times daily as needed for muscle spasms. 11/16/16   Triplett, Cari B, FNP  naproxen (NAPROSYN) 500 MG tablet Take 1 tablet (500 mg total) by mouth 2 (two) times daily with a meal. 11/16/16   Triplett, Kasandra Knudsen, FNP    Family History Family History  Problem Relation Age of Onset  . Cancer Paternal Grandfather      Social History Social History  Substance Use Topics  . Smoking status: Never Smoker  . Smokeless tobacco: Never Used  . Alcohol use Yes     Allergies   Patient has no known allergies.   Review of Systems Review of Systems  Constitutional: Negative for chills and fever.  HENT: Positive for mouth sores (lower lip), rhinorrhea and sore throat. Negative for drooling, ear discharge, ear pain, facial swelling and trouble swallowing.   Respiratory: Positive for cough. Negative for shortness of breath and wheezing.   Cardiovascular: Positive for chest pain (soreness from coughing). Negative for leg swelling.  Gastrointestinal: Negative for abdominal pain, constipation, diarrhea, nausea and vomiting.  Genitourinary: Negative for dysuria, hematuria, vaginal bleeding and vaginal discharge.  Musculoskeletal: Negative for arthralgias and myalgias.  Skin: Positive for wound (lower lip).  Allergic/Immunologic: Negative for immunocompromised state.  Neurological: Negative for weakness and numbness.  Psychiatric/Behavioral: Negative for confusion.   All other systems reviewed and are negative for acute change except as noted in the HPI.    Physical Exam Updated Vital Signs BP 106/75   Pulse 90   Temp 98.6 F (37 C) (Oral)   Resp 17   Ht 5\' 4"  (1.626 m)   Wt 68 kg (150 lb)   LMP 05/03/2017 (Approximate)   SpO2 99%   BMI 25.75 kg/m   Physical Exam  Constitutional: She is oriented to person, place, and time. Vital signs are normal. She appears well-developed and well-nourished.  Non-toxic appearance. No distress.  Afebrile, nontoxic, NAD  HENT:  Head: Normocephalic and atraumatic.  Nose: Mucosal edema present.  Mouth/Throat: Uvula is midline and mucous membranes are normal. Oral lesions present. No trismus in the jaw. No uvula swelling. Posterior oropharyngeal erythema present. No oropharyngeal exudate, posterior oropharyngeal edema or tonsillar abscesses. Tonsils are 2+ on the  right. Tonsils are 2+ on the left. No tonsillar exudate.    Macerated skin lesion to just below lower lip, with small vesicles to border of lesion, with honey colored crusting over the macerated area, no erythema or warmth, no drainage, no red streaking, no fluctuance or induration, no swelling, mildly TTP Nose mildly congested. Oropharynx injected, without uvular swelling or deviation, no trismus or drooling, with 2+ bilateral tonsillar swelling and erythema, no exudates.  No evidence of ludwig's or PTA. No tongue/lip/face swelling. Airway patent.   Eyes: Conjunctivae and EOM are normal. Right eye exhibits no discharge. Left eye exhibits no discharge.  Neck: Normal range of motion. Neck supple.  Cardiovascular: Normal rate, regular rhythm, normal heart sounds and intact distal pulses.  Exam reveals no gallop and no friction rub.   No murmur heard. RRR, nl s1/s2, no m/r/g, distal pulses intact, no pedal edema   Pulmonary/Chest: Effort normal and breath sounds normal. No respiratory distress. She has no decreased breath sounds. She has no wheezes. She has no rhonchi.  She has no rales. She exhibits tenderness. She exhibits no crepitus, no deformity and no retraction.    CTAB in all lung fields, no w/r/r, no hypoxia or increased WOB, speaking in full sentences, SpO2 99% on RA Chest wall with mild anterior rib cage/sternal TTP without crepitus, deformities, or retractions   Abdominal: Soft. Normal appearance and bowel sounds are normal. She exhibits no distension. There is no tenderness. There is no rigidity, no rebound, no guarding, no CVA tenderness, no tenderness at McBurney's point and negative Murphy's sign.  Musculoskeletal: Normal range of motion.  MAE x4 Strength and sensation grossly intact in all extremities Distal pulses intact Gait steady No pedal edema, neg homan's bilaterally   Lymphadenopathy:       Head (right side): Tonsillar adenopathy present.       Head (left side): Tonsillar  adenopathy present.  B/l tonsillar LAD which is mildly TTP  Neurological: She is alert and oriented to person, place, and time. She has normal strength. No sensory deficit.  Skin: Skin is warm, dry and intact. No rash noted.  Psychiatric: She has a normal mood and affect.  Nursing note and vitals reviewed.    ED Treatments / Results  Labs (all labs ordered are listed, but only abnormal results are displayed) Labs Reviewed  RAPID STREP SCREEN (NOT AT [facility]) - Abnormal; Notable for the following:       Result Value   Streptococcus, Group A Screen (Direct) POSITIVE (*)    All other components within normal limits    EKG  EKG Interpretation None       Radiology Dg Chest 2 View  Result Date: 05/23/2017 CLINICAL DATA:  Bilateral upper chest pain with dry cough.  Chills. EXAM: CHEST  2 VIEW COMPARISON:  None. FINDINGS: The heart size and mediastinal contours are within normal limits. Both lungs are clear. The visualized skeletal structures are unremarkable. IMPRESSION: No active cardiopulmonary disease. Electronically Signed   By: Gerome Samavid  Zaino III M.D   On: 05/23/2017 22:01    Procedures Procedures (including critical care time)  Medications Ordered in ED Medications  penicillin g procaine-penicillin g benzathine (BICILLIN-CR) injection 600000-600000 units (not administered)     Initial Impression / Assessment and Plan / ED Course  I have reviewed the triage vital signs and the nursing notes.  Pertinent labs & imaging results that were available during my care of the patient were reviewed by me and considered in my medical decision making (see chart for details).     21 y.o. female here with two separate complaints; first complaint is 1wk of a lower lip lesion that has become macerated and crusted, started out as a blister/bump and then opened up, started crusting, and is irritated. No known sick contacts. On exam, irritated skin just under lower lip with honey colored  crusting, no erythema or warmth, no drainage, mild TTP, some small vesicles noted to the edge of the wound, but mostly looks like macerated skin. No tongue/lip/face swelling. No induration. Appears c/w impetigo, could be HSV given proximity to the mouth, so will also empirically cover for this as well. She just went to the health dept and had full STD testing on 05/17/17, which was all negative, doubt need for repeat testing here.   Second complaint is URI symptoms that began today; c/o dry cough, sore throat, rhinorrhea, and chest soreness with coughing. On exam, mild anterior chest wall soreness, no tachycardia or hypoxia, clear lung exam, no pedal edema, tonsils 2+ bilaterally and  erythematous but without exudates, no evidence of PTA/ludwig's, b/l tonsillar LAD which is mildly TTP. Will get RST and CXR, then reassess shortly.   11:24 PM RST+. CXR negative. Will treat strep throat today with IM PCN at pt's preference. Regarding URI symptoms/cough/sore throat, advised OTC remedies for symptomatic relief, salt water gargles, tylenol/motrin, etc, and f/up with PCP in 1wk for recheck. As for cold sore, likely impetigo, will send home with mupirocin cream, and use valtrex in the event it is HSV related. Advised proper wound care of this area. F/up with PCP in 1wk for recheck. I explained the diagnosis and have given explicit precautions to return to the ER including for any other new or worsening symptoms. The patient understands and accepts the medical plan as it's been dictated and I have answered their questions. Discharge instructions concerning home care and prescriptions have been given. The patient is STABLE and is discharged to home in good condition.    Final Clinical Impressions(s) / ED Diagnoses   Final diagnoses:  Impetigo  Cold sore  Strep pharyngitis  Costochondritis  Upper respiratory tract infection, unspecified type    New Prescriptions New Prescriptions   MUPIROCIN OINTMENT  (BACTROBAN) 2 %    Apply 1 application topically 3 (three) times daily. Apply to affected area TID x5 days   VALACYCLOVIR (VALTREX) 1000 MG TABLET    Take 1 tablet (1,000 mg total) by mouth 2 (two) times daily. x10 days     [address] [place], [place] 05/23/17 2325    Loren Racer, MD 05/24/17 [phone]

## 2017-05-23 NOTE — ED Notes (Signed)
Pt ambulatory and independent at discharge.  Verbalized understanding of discharge instructions 

## 2017-05-31 ENCOUNTER — Encounter (HOSPITAL_COMMUNITY): Payer: Self-pay | Admitting: Emergency Medicine

## 2017-05-31 ENCOUNTER — Ambulatory Visit (HOSPITAL_COMMUNITY)
Admission: EM | Admit: 2017-05-31 | Discharge: 2017-05-31 | Disposition: A | Discharge status: Home / Self Care | Payer: Medicaid Other | Attending: Family Medicine | Admitting: Family Medicine

## 2017-05-31 DIAGNOSIS — J020 Streptococcal pharyngitis: Secondary | ICD-10-CM

## 2017-05-31 DIAGNOSIS — L0100 Impetigo, unspecified: Secondary | ICD-10-CM

## 2017-05-31 NOTE — ED Triage Notes (Signed)
Pt was treated for Impetigo and Strep Throat 8 days ago.  Pt needs a note for work stating she is able to go back to work and that she is no longer contagious.

## 2017-06-02 NOTE — ED Provider Notes (Signed)
  [facility]   [id number] 05/31/17 Arrival Time: 1339  ASSESSMENT & PLAN:  1. Impetigo   2. Streptococcal sore throat    Both have resolved. Requests work note that was given. May f/u as needed.   Reviewed expectations re: course of current medical issues. Questions answered. Outlined signs and symptoms indicating need for more acute intervention. Patient verbalized understanding. After Visit Summary given.   SUBJECTIVE:  Regina Reeves is a 21 y.o. female who presents for a work note. Reports resolved impetigo and strep throat. No current worries.  ROS: As per HPI.   OBJECTIVE:  Vitals:   05/31/17 1428  BP: 102/60  Pulse: 81  Temp: 99.1 F (37.3 C)  TempSrc: Oral  SpO2: 100%    General appearance: alert; no distress HENT: normocephalic; atraumatic; TMs normal; nasal mucosa normal; oral mucosa normal Neck: supple Lungs: clear to auscultation bilaterally Heart: regular rate and rhythm Extremities: no cyanosis or edema; symmetrical with no gross deformities Skin: warm and dry Psychological: alert and cooperative; normal mood and affect  Labs: Results for orders placed or performed during the hospital encounter of 05/23/17  Rapid strep screen (not [facility])  Result Value Ref Range   Streptococcus, Group A Screen (Direct) POSITIVE (A) NEGATIVE    No Known Allergies  Past Medical History:  Diagnosis Date  . H/O measles   . H/O varicella   . Irregular periods/menstrual cycles   . STD (sexually transmitted disease) 2015   CG   Social History   Socioeconomic History  . Marital status: Single    Spouse name: Not on file  . Number of children: Not on file  . Years of education: Not on file  . Highest education level: Not on file  Social Needs  . Financial resource strain: Not on file  . Food insecurity - worry: Not on file  . Food insecurity - inability: Not on file  . Transportation needs - medical: Not on file  . Transportation needs -  non-medical: Not on file  Occupational History  . Not on file  Tobacco Use  . Smoking status: Never Smoker  . Smokeless tobacco: Never Used  Substance and Sexual Activity  . Alcohol use: Yes  . Drug use: No  . Sexual activity: Yes    Birth control/protection: None  Other Topics Concern  . Not on file  Social History Narrative  . Not on file   Family History  Problem Relation Age of Onset  . Cancer Paternal Grandfather    Past Surgical History:  Procedure Laterality Date  . NO PAST SURGERIES       Mardella LaymanHagler, Wynell Halberg, MD 06/02/17 [phone]

## 2017-07-06 ENCOUNTER — Emergency Department
Admission: EM | Admit: 2017-07-06 | Discharge: 2017-07-06 | Disposition: A | Discharge status: Home / Self Care | Payer: No Typology Code available for payment source | Attending: Emergency Medicine | Admitting: Emergency Medicine

## 2017-07-06 ENCOUNTER — Other Ambulatory Visit: Payer: Self-pay

## 2017-07-06 ENCOUNTER — Emergency Department: Payer: No Typology Code available for payment source

## 2017-07-06 ENCOUNTER — Encounter: Payer: Self-pay | Admitting: Emergency Medicine

## 2017-07-06 DIAGNOSIS — Y939 Activity, unspecified: Secondary | ICD-10-CM | POA: Diagnosis not present

## 2017-07-06 DIAGNOSIS — Y92410 Unspecified street and highway as the place of occurrence of the external cause: Secondary | ICD-10-CM | POA: Insufficient documentation

## 2017-07-06 DIAGNOSIS — Y999 Unspecified external cause status: Secondary | ICD-10-CM | POA: Diagnosis not present

## 2017-07-06 DIAGNOSIS — S62390A Other fracture of second metacarpal bone, right hand, initial encounter for closed fracture: Secondary | ICD-10-CM | POA: Insufficient documentation

## 2017-07-06 DIAGNOSIS — S6991XA Unspecified injury of right wrist, hand and finger(s), initial encounter: Secondary | ICD-10-CM | POA: Diagnosis present

## 2017-07-06 MED ORDER — IBUPROFEN 600 MG PO TABS
600.0000 mg | ORAL_TABLET | Freq: Once | ORAL | Status: AC
  Administered 2017-07-06: 600 mg via ORAL
  Filled 2017-07-06: qty 1

## 2017-07-06 MED ORDER — HYDROCODONE-ACETAMINOPHEN 5-325 MG PO TABS: 1.0000 | ORAL_TABLET | ORAL | 0 refills | Status: AC | PRN

## 2017-07-06 MED ORDER — CYCLOBENZAPRINE HCL 10 MG PO TABS: 10.0000 mg | ORAL_TABLET | Freq: Three times a day (TID) | ORAL | 0 refills | Status: DC | PRN

## 2017-07-06 MED ORDER — NAPROXEN 500 MG PO TABS: 500.0000 mg | ORAL_TABLET | Freq: Two times a day (BID) | ORAL | 0 refills | Status: DC
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
## 2017-07-06 NOTE — Discharge Instructions (Signed)
Please follow up with the hand specialist. Call to schedule an appointment. Return to the ER for symptoms of concern if you are unable to see primary care or the hand specialist.

## 2017-09-21 ENCOUNTER — Emergency Department
Admission: EM | Admit: 2017-09-21 | Discharge: 2017-09-21 | Disposition: A | Discharge status: Home / Self Care | Payer: Medicaid Other | Attending: Emergency Medicine | Admitting: Emergency Medicine

## 2017-09-21 DIAGNOSIS — O9989 Other specified diseases and conditions complicating pregnancy, childbirth and the puerperium: Secondary | ICD-10-CM | POA: Insufficient documentation

## 2017-09-21 DIAGNOSIS — O219 Vomiting of pregnancy, unspecified: Secondary | ICD-10-CM | POA: Insufficient documentation

## 2017-09-21 DIAGNOSIS — O26899 Other specified pregnancy related conditions, unspecified trimester: Secondary | ICD-10-CM | POA: Insufficient documentation

## 2017-09-21 DIAGNOSIS — Z3A01 Less than 8 weeks gestation of pregnancy: Secondary | ICD-10-CM | POA: Diagnosis not present

## 2017-09-21 DIAGNOSIS — R1031 Right lower quadrant pain: Secondary | ICD-10-CM | POA: Insufficient documentation

## 2017-09-21 LAB — CBC
HEMATOCRIT: 38.4 % (ref 35.0–47.0)
HEMOGLOBIN: 12.8 g/dL (ref 12.0–16.0)
MCH: 27.3 pg (ref 26.0–34.0)
MCHC: 33.2 g/dL (ref 32.0–36.0)
MCV: 82.2 fL (ref 80.0–100.0)
Platelets: 273 10*3/uL (ref 150–440)
RBC: 4.68 MIL/uL (ref 3.80–5.20)
RDW: 14.4 % (ref 11.5–14.5)
WBC: 7.2 10*3/uL (ref 3.6–11.0)

## 2017-09-21 LAB — COMPREHENSIVE METABOLIC PANEL
ALBUMIN: 4 g/dL (ref 3.5–5.0)
ALT: 12 U/L — ABNORMAL LOW (ref 14–54)
ANION GAP: 10 (ref 5–15)
AST: 21 U/L (ref 15–41)
Alkaline Phosphatase: 45 U/L (ref 38–126)
BUN: 7 mg/dL (ref 6–20)
CALCIUM: 9 mg/dL (ref 8.9–10.3)
CO2: 21 mmol/L — AB (ref 22–32)
Chloride: 106 mmol/L (ref 101–111)
Creatinine, Ser: 0.7 mg/dL (ref 0.44–1.00)
GFR calc non Af Amer: >60 mL/min (ref >60)
GLUCOSE: 91 mg/dL (ref 65–99)
POTASSIUM: 3.4 mmol/L — AB (ref 3.5–5.1)
SODIUM: 137 mmol/L (ref 135–145)
Total Bilirubin: 0.6 mg/dL (ref 0.3–1.2)
Total Protein: 7.6 g/dL (ref 6.5–8.1)

## 2017-09-21 LAB — URINALYSIS, COMPLETE (UACMP) WITH MICROSCOPIC
BACTERIA UA: NONE SEEN
Bilirubin Urine: NEGATIVE
GLUCOSE, UA: NEGATIVE mg/dL
HGB URINE DIPSTICK: NEGATIVE
KETONES UR: NEGATIVE mg/dL
Leukocytes, UA: NEGATIVE
NITRITE: NEGATIVE
PROTEIN: NEGATIVE mg/dL
RBC / HPF: NONE SEEN RBC/hpf (ref 0–5)
Specific Gravity, Urine: 1.019 (ref 1.005–1.030)
pH: 6 (ref 5.0–8.0)

## 2017-09-21 LAB — LIPASE, BLOOD: LIPASE: 19 U/L (ref 11–51)

## 2017-09-21 LAB — HCG, QUANTITATIVE, PREGNANCY: hCG, Beta Chain, Quant, S: 42517 m[IU]/mL — ABNORMAL HIGH (ref <5)

## 2017-09-21 LAB — POCT PREGNANCY, URINE: PREG TEST UR: POSITIVE — AB

## 2017-09-21 NOTE — ED Notes (Signed)
Triage completed by this RN. 

## 2017-09-21 NOTE — Discharge Instructions (Signed)
Please follow up closely with your OB, return to the ED if any abd pain or vaginal bleeding

## 2017-09-21 NOTE — ED Notes (Signed)
Pt verbalized understanding of discharge instructions. NAD at this time. 

## 2017-09-21 NOTE — ED Provider Notes (Signed)
[facility] Emergency Department Provider Note   ____________________________________________    I have reviewed the triage vital signs and the nursing notes.   HISTORY  Chief Complaint Emesis     HPI Regina SidleShanna M Merkin is a 22 y.o. female who presents with complaints of nausea and vomiting.  Patient reports over the last several days she has had nausea and vomiting primarily in the morning.  She has had occasional intermittent right lower quadrant cramping as well but none today.  No vaginal bleeding.  Vaginal discharge.  No dysuria.  Last menstrual cycle was in early January.  She has 1 child, born in 2012, vaginally, no issues during the pregnancy   Past Medical History:  Diagnosis Date  . H/O measles   . H/O varicella   . Irregular periods/menstrual cycles   . STD (sexually transmitted disease) 2015   CG    Patient Active Problem List   Diagnosis Date Noted  . Visit for routine gyn exam 08/17/2016  . Possible exposure to STD 08/17/2016    Past Surgical History:  Procedure Laterality Date  . NO PAST SURGERIES      Prior to Admission medications   Medication Sig Start Date End Date Taking? Authorizing Provider  acetaminophen (TYLENOL) 325 MG tablet Take 650 mg by mouth every 6 (six) hours as needed for moderate pain.    [provider]  cyclobenzaprine (FLEXERIL) 10 MG tablet Take 1 tablet (10 mg total) by mouth 3 (three) times daily as needed for muscle spasms. 07/06/17   Triplett, Rulon Eisenmengerari B, FNP  HYDROcodone-acetaminophen (NORCO/VICODIN) 5-325 MG tablet Take 1 tablet by mouth every 4 (four) hours as needed for moderate pain. 07/06/17 07/06/18  Triplett, Rulon Eisenmengerari B, FNP  naproxen (NAPROSYN) 500 MG tablet Take 1 tablet (500 mg total) by mouth 2 (two) times daily with a meal. 07/06/17   Triplett, Cari B, FNP     Allergies Patient has no known allergies.  Family History  Problem Relation Age of Onset  . Cancer Paternal Grandfather       Social History Social History   Tobacco Use  . Smoking status: Never Smoker  . Smokeless tobacco: Never Used  Substance Use Topics  . Alcohol use: Yes  . Drug use: No    Review of Systems  Constitutional: No fever/chills Eyes: No visual changes.  ENT: No sore throat. Cardiovascular: No palpitations Respiratory: No cough Gastrointestinal: Intermittent right lower quadrant cramping Genitourinary: Negative for dysuria.  Vaginal bleeding Musculoskeletal: Negative for back pain. Skin: Negative for rash. Neurological: Negative for headaches    ____________________________________________   PHYSICAL EXAM:  VITAL SIGNS: ED Triage Vitals  Enc Vitals Group     BP 09/21/17 1139 119/67     Pulse Rate 09/21/17 1139 77     Resp 09/21/17 1139 16     Temp 09/21/17 1139 98.9 F (37.2 C)     Temp Source 09/21/17 1139 Oral     SpO2 09/21/17 1139 100 %     Weight 09/21/17 1139 70.3 kg (155 lb)     Height 09/21/17 1139 1.626 m (5\' 4" )     Head Circumference --      Peak Flow --      Pain Score 09/21/17 1148 2     Pain Loc --      Pain Edu? --      Excl. in GC? --     Constitutional: Alert and oriented. No acute distress. Pleasant and interactive Eyes:  Conjunctivae are normal.   Nose: No congestion/rhinnorhea. Mouth/Throat: Mucous membranes are moist.    Cardiovascular: Normal rate, regular rhythm. Good peripheral circulation. Respiratory: Normal respiratory effort.  No retractions.  Gastrointestinal: Soft and nontender. No distention.  No CVA tenderness. Genitourinary: deferred Musculoskeletal:   Warm and well perfused Neurologic:  Normal speech and language. No gross focal neurologic deficits are appreciated.  Skin:  Skin is warm, dry and intact. No rash noted. Psychiatric: Mood and affect are normal. Speech and behavior are normal.  ____________________________________________   LABS (all labs ordered are listed, but only abnormal results are displayed)  Labs  Reviewed  COMPREHENSIVE METABOLIC PANEL - Abnormal; Notable for the following components:      Result Value   Potassium 3.4 (*)    CO2 21 (*)    ALT 12 (*)    All other components within normal limits  URINALYSIS, COMPLETE (UACMP) WITH MICROSCOPIC - Abnormal; Notable for the following components:   Color, Urine YELLOW (*)    APPearance HAZY (*)    Squamous Epithelial / LPF 6-30 (*)    All other components within normal limits  POCT PREGNANCY, URINE - Abnormal; Notable for the following components:   Preg Test, Ur POSITIVE (*)    All other components within normal limits  LIPASE, BLOOD  CBC  HCG, QUANTITATIVE, PREGNANCY  POC URINE PREG, ED   ____________________________________________  EKG  None ____________________________________________  RADIOLOGY  Ultrasound pending ____________________________________________   PROCEDURES  Procedure(s) performed: No  Procedures   Critical Care performed: No ____________________________________________   INITIAL IMPRESSION / ASSESSMENT AND PLAN / ED COURSE  Pertinent labs & imaging results that were available during my care of the patient were reviewed by me and considered in my medical decision making (see chart for details).  Patient well-appearing in no acute distress.  Abdominal exam is overall reassuring, she is not having abdominal pain currently.  Pregnancy test is positive, notified her of this, she is surprised.  Given her intermittent cramping in her right lower quadrant recommended ultrasound to rule out ectopic however the patient is unwilling to wait for ultrasound, she understands that cannot rule out ectopic/tubal pregnancy and no she can return at any time if lower abdominal pain or vaginal bleeding.  She will follow-up with OB.    ____________________________________________   FINAL CLINICAL IMPRESSION(S) / ED DIAGNOSES  Final diagnoses:  Less than [redacted] weeks gestation of pregnancy        Note:  This  document was prepared using Dragon voice recognition software and may include unintentional dictation errors.    Jene Every, MD 09/21/17 1320

## 2017-09-21 NOTE — ED Triage Notes (Signed)
Pt to ED w/ c/o of emesis that has been going on for a few days but increased today. Pt states lower abdominal pain. Pt denis diarrhea.

## 2017-09-30 ENCOUNTER — Inpatient Hospital Stay (HOSPITAL_COMMUNITY)
Admission: AD | Admit: 2017-09-30 | Discharge: 2017-09-30 | Disposition: A | Discharge status: Home / Self Care | Payer: Self-pay | Source: Ambulatory Visit | Attending: Obstetrics & Gynecology | Admitting: Obstetrics & Gynecology

## 2017-09-30 ENCOUNTER — Inpatient Hospital Stay (HOSPITAL_COMMUNITY): Payer: Self-pay

## 2017-09-30 ENCOUNTER — Encounter (HOSPITAL_COMMUNITY): Payer: Self-pay | Admitting: *Deleted

## 2017-09-30 DIAGNOSIS — O26891 Other specified pregnancy related conditions, first trimester: Secondary | ICD-10-CM | POA: Insufficient documentation

## 2017-09-30 DIAGNOSIS — R109 Unspecified abdominal pain: Secondary | ICD-10-CM

## 2017-09-30 DIAGNOSIS — R102 Pelvic and perineal pain: Secondary | ICD-10-CM | POA: Insufficient documentation

## 2017-09-30 DIAGNOSIS — E860 Dehydration: Secondary | ICD-10-CM | POA: Insufficient documentation

## 2017-09-30 DIAGNOSIS — K5900 Constipation, unspecified: Secondary | ICD-10-CM

## 2017-09-30 DIAGNOSIS — Z8619 Personal history of other infectious and parasitic diseases: Secondary | ICD-10-CM | POA: Insufficient documentation

## 2017-09-30 DIAGNOSIS — O26899 Other specified pregnancy related conditions, unspecified trimester: Secondary | ICD-10-CM

## 2017-09-30 DIAGNOSIS — O219 Vomiting of pregnancy, unspecified: Secondary | ICD-10-CM | POA: Insufficient documentation

## 2017-09-30 DIAGNOSIS — O99611 Diseases of the digestive system complicating pregnancy, first trimester: Secondary | ICD-10-CM | POA: Insufficient documentation

## 2017-09-30 DIAGNOSIS — Z3A01 Less than 8 weeks gestation of pregnancy: Secondary | ICD-10-CM | POA: Insufficient documentation

## 2017-09-30 DIAGNOSIS — O2691 Pregnancy related conditions, unspecified, first trimester: Secondary | ICD-10-CM

## 2017-09-30 DIAGNOSIS — Z3201 Encounter for pregnancy test, result positive: Secondary | ICD-10-CM | POA: Insufficient documentation

## 2017-09-30 DIAGNOSIS — O99281 Endocrine, nutritional and metabolic diseases complicating pregnancy, first trimester: Secondary | ICD-10-CM | POA: Insufficient documentation

## 2017-09-30 LAB — URINALYSIS, ROUTINE W REFLEX MICROSCOPIC
BILIRUBIN URINE: NEGATIVE
Glucose, UA: NEGATIVE mg/dL
Hgb urine dipstick: NEGATIVE
Ketones, ur: 80 mg/dL — AB
Nitrite: NEGATIVE
Protein, ur: NEGATIVE mg/dL
SPECIFIC GRAVITY, URINE: 1.016 (ref 1.005–1.030)
pH: 6 (ref 5.0–8.0)

## 2017-09-30 LAB — CBC WITH DIFFERENTIAL/PLATELET
Basophils Absolute: 0 10*3/uL (ref 0.0–0.1)
Basophils Relative: 0 %
EOS ABS: 0 10*3/uL (ref 0.0–0.7)
Eosinophils Relative: 0 %
HCT: 36.6 % (ref 36.0–46.0)
HEMOGLOBIN: 12.6 g/dL (ref 12.0–15.0)
LYMPHS ABS: 2.1 10*3/uL (ref 0.7–4.0)
Lymphocytes Relative: 24 %
MCH: 27.8 pg (ref 26.0–34.0)
MCHC: 34.4 g/dL (ref 30.0–36.0)
MCV: 80.8 fL (ref 78.0–100.0)
Monocytes Absolute: 0.3 10*3/uL (ref 0.1–1.0)
Monocytes Relative: 4 %
NEUTROS PCT: 72 %
Neutro Abs: 6.2 10*3/uL (ref 1.7–7.7)
Platelets: 250 10*3/uL (ref 150–400)
RBC: 4.53 MIL/uL (ref 3.87–5.11)
RDW: 13.7 % (ref 11.5–15.5)
WBC: 8.6 10*3/uL (ref 4.0–10.5)

## 2017-09-30 LAB — WET PREP, GENITAL
CLUE CELLS WET PREP: NONE SEEN
Sperm: NONE SEEN
Trich, Wet Prep: NONE SEEN
Yeast Wet Prep HPF POC: NONE SEEN

## 2017-09-30 LAB — HCG, QUANTITATIVE, PREGNANCY: HCG, BETA CHAIN, QUANT, S: 132870 m[IU]/mL — AB (ref <5)

## 2017-09-30 MED ORDER — DEXTROSE IN LACTATED RINGERS 5 % IV SOLN
INTRAVENOUS | Status: DC
Start: 2017-09-30 — End: 2017-10-01
  Administered 2017-09-30: 23:00:00 via INTRAVENOUS

## 2017-09-30 MED ORDER — ONDANSETRON 4 MG PO TBDP
4.0000 mg | ORAL_TABLET | Freq: Once | ORAL | Status: AC
  Administered 2017-09-30: 4 mg via ORAL
  Filled 2017-09-30: qty 1

## 2017-09-30 MED ORDER — POLYETHYLENE GLYCOL 3350 17 G PO PACK: 17.0000 g | PACK | Freq: Every day | ORAL | 0 refills | Status: DC

## 2017-09-30 MED ORDER — LACTATED RINGERS IV SOLN
INTRAVENOUS | Status: DC
  Administered 2017-09-30: 999 mL via INTRAVENOUS

## 2017-09-30 MED ORDER — PROMETHAZINE HCL 25 MG PO TABS: 25.0000 mg | ORAL_TABLET | Freq: Four times a day (QID) | ORAL | 2 refills | Status: DC | PRN

## 2017-09-30 NOTE — MAU Provider Note (Signed)
Chief Complaint: Abdominal Pain   First Provider Initiated Contact with Patient 09/30/17 2016        SUBJECTIVE HPI: Regina Reeves is a 22 y.o. G2P1001 at [redacted]w[redacted]d by LMP who presents to maternity admissions reporting lower abdominal pain since 2 weeks ago.  Seen at [facility] with labs done.  No bleeding. .Reports nausea and vomiting. States has lost 8 lbs.  Constipated She denies vaginal bleeding, vaginal itching/burning, urinary symptoms, h/a, dizziness, or fever/chills.    Abdominal Pain  This is a new problem. The current episode started 1 to 4 weeks ago. The onset quality is gradual. The problem occurs intermittently. The problem has been unchanged. The pain is located in the LLQ, RLQ and suprapubic region. The quality of the pain is cramping. Associated symptoms include constipation, nausea, vomiting and weight loss. Pertinent negatives include no diarrhea, dysuria or fever. Nothing aggravates the pain. The pain is relieved by nothing. She has tried nothing for the symptoms.  Emesis   The current episode started 1 to 4 weeks ago. The problem has been unchanged. There has been no fever. Associated symptoms include abdominal pain and weight loss. Pertinent negatives include no diarrhea or fever. She has tried nothing for the symptoms.    RN Note: PT SAYS SHE HAS ABD PAIN - STARTED ON 2-28 .Marland Kitchen     WENT  TO  [place] - HAD  LABS. ON 2-26.   VOMITING STARTED     NO APPOINTMENT  WITH DR.   PLANS TO GET AN TAB NEXT WEEK.    Past Medical History:  Diagnosis Date  . H/O measles   . H/O varicella   . Irregular periods/menstrual cycles   . STD (sexually transmitted disease) 2015   CG   Past Surgical History:  Procedure Laterality Date  . NO PAST SURGERIES     Social History   Socioeconomic History  . Marital status: Single    Spouse name: Not on file  . Number of children: Not on file  . Years of education: Not on file  . Highest education level: Not on file  Social Needs  .  Financial resource strain: Not on file  . Food insecurity - worry: Not on file  . Food insecurity - inability: Not on file  . Transportation needs - medical: Not on file  . Transportation needs - non-medical: Not on file  Occupational History  . Not on file  Tobacco Use  . Smoking status: Never Smoker  . Smokeless tobacco: Never Used  Substance and Sexual Activity  . Alcohol use: No    Frequency: Never  . Drug use: No  . Sexual activity: Not Currently    Birth control/protection: None  Other Topics Concern  . Not on file  Social History Narrative  . Not on file   No current facility-administered medications on file prior to encounter.    Current Outpatient Medications on File Prior to Encounter  Medication Sig Dispense Refill  . acetaminophen (TYLENOL) 325 MG tablet Take 650 mg by mouth every 6 (six) hours as needed for moderate pain.    . cyclobenzaprine (FLEXERIL) 10 MG tablet Take 1 tablet (10 mg total) by mouth 3 (three) times daily as needed for muscle spasms. 30 tablet 0  . HYDROcodone-acetaminophen (NORCO/VICODIN) 5-325 MG tablet Take 1 tablet by mouth every 4 (four) hours as needed for moderate pain. 20 tablet 0  . naproxen (NAPROSYN) 500 MG tablet Take 1 tablet (500 mg total) by mouth 2 (two)  times daily with a meal. 30 tablet 0   No Known Allergies  I have reviewed patient's Past Medical Hx, Surgical Hx, Family Hx, Social Hx, medications and allergies.   ROS:  Review of Systems  Constitutional: Positive for weight loss. Negative for fever.  Gastrointestinal: Positive for abdominal pain, constipation, nausea and vomiting. Negative for diarrhea.  Genitourinary: Negative for dysuria.   Review of Systems  Other systems negative   Physical Exam  Physical Exam Patient Vitals for the past 24 hrs:  BP Temp Temp src Pulse Resp Height Weight  09/30/17 1958 110/77 98.5 F (36.9 C) Oral 77 20 5\' 4"  (1.626 m) 145 lb 4 oz (65.9 kg)   Constitutional: Well-developed,  well-nourished female in no acute distress.  Cardiovascular: normal rate Respiratory: normal effort GI: Abd soft, non-tender. Pos BS x 4 MS: Extremities nontender, no edema, normal ROM Neurologic: Alert and oriented x 4.  GU: Neg CVAT.  PELVIC EXAM: Cervix pink, visually closed, without lesion, scant white creamy discharge, vaginal walls and external genitalia normal Bimanual exam: Cervix 0/long/high, firm, anterior, neg CMT, uterus mildly tender, enlarged to 8 wks size;    adnexa without tenderness, enlargement, or mass   LAB RESULTS Results for orders placed or performed during the hospital encounter of 09/30/17 (from the past 24 hour(s))  Urinalysis, Routine w reflex microscopic     Status: Abnormal   Collection Time: 09/30/17  8:00 PM  Result Value Ref Range   Color, Urine YELLOW YELLOW   APPearance CLOUDY (A) CLEAR   Specific Gravity, Urine 1.016 1.005 - 1.030   pH 6.0 5.0 - 8.0   Glucose, UA NEGATIVE NEGATIVE mg/dL   Hgb urine dipstick NEGATIVE NEGATIVE   Bilirubin Urine NEGATIVE NEGATIVE   Ketones, ur 80 (A) NEGATIVE mg/dL   Protein, ur NEGATIVE NEGATIVE mg/dL   Nitrite NEGATIVE NEGATIVE   Leukocytes, UA SMALL (A) NEGATIVE   RBC / HPF 6-30 0 - 5 RBC/hpf   WBC, UA 0-5 0 - 5 WBC/hpf   Bacteria, UA RARE (A) NONE SEEN   Squamous Epithelial / LPF 6-30 (A) NONE SEEN   Mucus PRESENT   CBC with Differential/Platelet     Status: None   Collection Time: 09/30/17  8:16 PM  Result Value Ref Range   WBC 8.6 4.0 - 10.5 K/uL   RBC 4.53 3.87 - 5.11 MIL/uL   Hemoglobin 12.6 12.0 - 15.0 g/dL   HCT 16.136.6 09.636.0 - 04.546.0 %   MCV 80.8 78.0 - 100.0 fL   MCH 27.8 26.0 - 34.0 pg   MCHC 34.4 30.0 - 36.0 g/dL   RDW 40.913.7 81.111.5 - 91.415.5 %   Platelets 250 150 - 400 K/uL   Neutrophils Relative % 72 %   Neutro Abs 6.2 1.7 - 7.7 K/uL   Lymphocytes Relative 24 %   Lymphs Abs 2.1 0.7 - 4.0 K/uL   Monocytes Relative 4 %   Monocytes Absolute 0.3 0.1 - 1.0 K/uL   Eosinophils Relative 0 %   Eosinophils  Absolute 0.0 0.0 - 0.7 K/uL   Basophils Relative 0 %   Basophils Absolute 0.0 0.0 - 0.1 K/uL  hCG, quantitative, pregnancy     Status: Abnormal   Collection Time: 09/30/17  8:16 PM  Result Value Ref Range   hCG, Beta Chain, Quant, S 132,870 (H) <5 mIU/mL  Wet prep, genital     Status: Abnormal   Collection Time: 09/30/17  8:40 PM  Result Value Ref Range   Yeast Wet  Prep HPF POC NONE SEEN NONE SEEN   Trich, Wet Prep NONE SEEN NONE SEEN   Clue Cells Wet Prep HPF POC NONE SEEN NONE SEEN   WBC, Wet Prep HPF POC MODERATE (A) NONE SEEN   Sperm NONE SEEN     IMAGING US Ob Comp Less 14 Wks  Result Date: 09/30/2017 CLINICAL DATA:  Initial evaluation for acute abdominal pain for 9 days, early pregnancy. EXAM: OBSTETRIC <14 WK [place] AND TRANSVAGINAL OB US TECHNIQUE: Both transabdominal and transvaginal ultrasound examinations were performed for complete evaluation of the gestation as well as the maternal uterus, adnexal regions, and pelvic cul-de-sac. Transvaginal technique was performed to assess early pregnancy. COMPARISON:  None. FINDINGS: Intrauterine gestational sac: Single Yolk sac:  Present Embryo:  Present Cardiac Activity: Present Heart Rate: 164 bpm CRL:  12.2 mm   7 w   3 d                  [place] EDC: 05/16/2018 Subchorionic hemorrhage:  None visualized. Maternal uterus/adnexae: Ovaries are normal in appearance bilaterally. No adnexal mass. No free fluid. IMPRESSION: 1. Single viable intrauterine pregnancy as above without complication, estimated gestational age [redacted] weeks and 3 days by crown-rump length. 2. No other acute maternal uterine or adnexal abnormality identified. Electronically Signed   By: Rise Mu M.D.   On: 09/30/2017 22:22     MAU Management/MDM: Ordered usual first trimester r/o ectopic labs.   Pelvic exam and cultures done Will check baseline Ultrasound to rule out ectopic.  This bleeding/pain can represent a normal pregnancy with bleeding, spontaneous abortion or even  an ectopic which can be life-threatening.  The process as listed above helps to determine which of these is present.  Reviewed results of [place].  Given 2 liters of IV fluids with Zofran   Got good relief from nausea Tolerated crackers and ginger ale   ASSESSMENT 1. Pelvic pain affecting pregnancy   2. Pelvic pain affecting pregnancy   3.      Single intrauterine pregnancy at [redacted]w[redacted]d 4.      Nausea and vomiting with weight loss 5.      Dehydration 6.      Constipation.  PLAN Discharge home Rx Phenergan for PRN use at home for nausea Rx Miralax for constipation Advance diet as tolerated   Pt stable at time of discharge. Encouraged to return here or to other Urgent Care/ED if she develops worsening of symptoms, increase in pain, fever, or other concerning symptoms.    Wynelle Bourgeois CNM, MSN Certified Nurse-Midwife 09/30/2017  8:45 PM

## 2017-09-30 NOTE — MAU Note (Signed)
PT SAYS SHE HAS ABD PAIN - STARTED ON 2-28 .Marland Kitchen.     WENT  TO  [place] - HAD  LABS. ON 2-26.   VOMITING STARTED     NO APPOINTMENT  WITH DR.   PLANS TO GET AN TAB NEXT WEEK.

## 2017-09-30 NOTE — Discharge Instructions (Signed)
° °Morning Sickness °Morning sickness is when you feel sick to your stomach (nauseous) during pregnancy. This nauseous feeling may or may not come with vomiting. It often occurs in the morning but can be a problem any time of day. Morning sickness is most common during the first trimester, but it may continue throughout pregnancy. While morning sickness is unpleasant, it is usually harmless unless you develop severe and continual vomiting (hyperemesis gravidarum). This condition requires more intense treatment. °What are the causes? °The cause of morning sickness is not completely known but seems to be related to normal hormonal changes that occur in pregnancy. °What increases the risk? °You are at greater risk if you: °· Experienced nausea or vomiting before your pregnancy. °· Had morning sickness during a previous pregnancy. °· Are pregnant with more than one baby, such as twins. °How is this treated? °Do not use any medicines (prescription, over-the-counter, or herbal) for morning sickness without first talking to your health care provider. Your health care provider may prescribe or recommend: °· Vitamin B6 supplements. °· Anti-nausea medicines. °· The herbal medicine ginger. °Follow these instructions at home: °· Only take over-the-counter or prescription medicines as directed by your health care provider. °· Taking multivitamins before getting pregnant can prevent or decrease the severity of morning sickness in most women. °· Eat a piece of dry toast or unsalted crackers before getting out of bed in the morning. °· Eat five or six small meals a day. °· Eat dry and bland foods (rice, baked potato). Foods high in carbohydrates are often helpful. °· Do not drink liquids with your meals. Drink liquids between meals. °· Avoid greasy, fatty, and spicy foods. °· Get someone to cook for you if the smell of any food causes nausea and vomiting. °· If you feel nauseous after taking prenatal vitamins, take the vitamins at  night or with a snack. °· Snack on protein foods (nuts, yogurt, cheese) between meals if you are hungry. °· Eat unsweetened gelatins for desserts. °· Wearing an acupressure wristband (worn for sea sickness) may be helpful. °· Acupuncture may be helpful. °· Do not smoke. °· Get a humidifier to keep the air in your house free of odors. °· Get plenty of fresh air. °Contact a health care provider if: °· Your home remedies are not working, and you need medicine. °· You feel dizzy or lightheaded. °· You are losing weight. °Get help right away if: °· You have persistent and uncontrolled nausea and vomiting. °· You pass out (faint). °This information is not intended to replace advice given to you by your health care provider. Make sure you discuss any questions you have with your health care provider. °Document Released: 09/03/2006 Document Revised: 12/19/2015 Document Reviewed: 12/28/2012 °Elsevier Interactive Patient Education © 2017 [employer] °First Trimester of Pregnancy °The first trimester of pregnancy is from week 1 until the end of week 13 (months 1 through 3). During this time, your baby will begin to develop inside you. At 6-8 weeks, the eyes and face are formed, and the heartbeat can be seen on ultrasound. At the end of 12 weeks, all the baby's organs are formed. Prenatal care is all the medical care you receive before the birth of your baby. Make sure you get good prenatal care and follow all of your doctor's instructions. °Follow these instructions at home: °Medicines  °· Take over-the-counter and prescription medicines only as told by your doctor. Some medicines are safe and some medicines are not safe during pregnancy. °·   Take a prenatal vitamin that contains at least 600 micrograms (mcg) of folic acid. °· If you have trouble pooping (constipation), take medicine that will make your stool soft (stool softener) if your doctor approves. °Eating and drinking  °· Eat regular, healthy meals. °· Your doctor  will tell you the amount of weight gain that is right for you. °· Avoid raw meat and uncooked cheese. °· If you feel sick to your stomach (nauseous) or throw up (vomit): °¨ Eat 4 or 5 small meals a day instead of 3 large meals. °¨ Try eating a few soda crackers. °¨ Drink liquids between meals instead of during meals. °· To prevent constipation: °¨ Eat foods that are high in fiber, like fresh fruits and vegetables, whole grains, and beans. °¨ Drink enough fluids to keep your pee (urine) clear or pale yellow. °Activity  °· Exercise only as told by your doctor. Stop exercising if you have cramps or pain in your lower belly (abdomen) or low back. °· Do not exercise if it is too hot, too humid, or if you are in a place of great height (high altitude). °· Try to avoid standing for long periods of time. Move your legs often if you must stand in one place for a long time. °· Avoid heavy lifting. °· Wear low-heeled shoes. Sit and stand up straight. °· You can have sex unless your doctor tells you not to. °Relieving pain and discomfort  °· Wear a good support bra if your breasts are sore. °· Take warm water baths (sitz baths) to soothe pain or discomfort caused by hemorrhoids. Use hemorrhoid cream if your doctor says it is okay. °· Rest with your legs raised if you have leg cramps or low back pain. °· If you have puffy, bulging veins (varicose veins) in your legs: °¨ Wear support hose or compression stockings as told by your doctor. °¨ Raise (elevate) your feet for 15 minutes, 3-4 times a day. °¨ Limit salt in your food. °Prenatal care  °· Schedule your prenatal visits by the twelfth week of pregnancy. °· Write down your questions. Take them to your prenatal visits. °· Keep all your prenatal visits as told by your doctor. This is important. °Safety  °· Wear your seat belt at all times when driving. °· Make a list of emergency phone numbers. The list should include numbers for family, friends, the hospital, and police and fire  departments. °General instructions  °· Ask your doctor for a referral to a local prenatal class. Begin classes no later than at the start of month 6 of your pregnancy. °· Ask for help if you need counseling or if you need help with nutrition. Your doctor can give you advice or tell you where to go for help. °· Do not use hot tubs, steam rooms, or saunas. °· Do not douche or use tampons or scented sanitary pads. °· Do not cross your legs for long periods of time. °· Avoid all herbs and alcohol. Avoid drugs that are not approved by your doctor. °· Do not use any tobacco products, including cigarettes, chewing tobacco, and electronic cigarettes. If you need help quitting, ask your doctor. You may get counseling or other support to help you quit. °· Avoid cat litter boxes and soil used by cats. These carry germs that can cause birth defects in the baby and can cause a loss of your baby (miscarriage) or stillbirth. °· Visit your dentist. At home, brush your teeth with a soft toothbrush.   Be gentle when you floss. °Contact a doctor if: °· You are dizzy. °· You have mild cramps or pressure in your lower belly. °· You have a nagging pain in your belly area. °· You continue to feel sick to your stomach, you throw up, or you have watery poop (diarrhea). °· You have a bad smelling fluid coming from your vagina. °· You have pain when you pee (urinate). °· You have increased puffiness (swelling) in your face, hands, legs, or ankles. °Get help right away if: °· You have a fever. °· You are leaking fluid from your vagina. °· You have spotting or bleeding from your vagina. °· You have very bad belly cramping or pain. °· You gain or lose weight rapidly. °· You throw up blood. It may look like coffee grounds. °· You are around people who have German measles, fifth disease, or chickenpox. °· You have a very bad headache. °· You have shortness of breath. °· You have any kind of trauma, such as from a fall or a car  accident. °Summary °· The first trimester of pregnancy is from week 1 until the end of week 13 (months 1 through 3). °· To take care of yourself and your unborn baby, you will need to eat healthy meals, take medicines only if your doctor tells you to do so, and do activities that are safe for you and your baby. °· Keep all follow-up visits as told by your doctor. This is important as your doctor will have to ensure that your baby is healthy and growing well. °This information is not intended to replace advice given to you by your health care provider. Make sure you discuss any questions you have with your health care provider. °Document Released: 12/30/2007 Document Revised: 07/21/2016 Document Reviewed: 07/21/2016 °Elsevier Interactive Patient Education © 2017 Elsevier Inc. ° °

## 2017-10-01 LAB — HIV ANTIBODY (ROUTINE TESTING W REFLEX): HIV Screen 4th Generation wRfx: NONREACTIVE

## 2017-10-02 LAB — GC/CHLAMYDIA PROBE AMP (~~LOC~~) NOT AT ARMC
Chlamydia: NEGATIVE
Neisseria Gonorrhea: NEGATIVE

## 2018-01-26 ENCOUNTER — Encounter (HOSPITAL_COMMUNITY): Payer: Self-pay

## 2018-01-26 ENCOUNTER — Other Ambulatory Visit: Payer: Self-pay

## 2018-01-26 ENCOUNTER — Emergency Department (HOSPITAL_COMMUNITY)
Admission: EM | Admit: 2018-01-26 | Discharge: 2018-01-26 | Disposition: A | Discharge status: Home / Self Care | Payer: Medicaid Other | Attending: Emergency Medicine | Admitting: Emergency Medicine

## 2018-01-26 DIAGNOSIS — Y998 Other external cause status: Secondary | ICD-10-CM | POA: Insufficient documentation

## 2018-01-26 DIAGNOSIS — W01198A Fall on same level from slipping, tripping and stumbling with subsequent striking against other object, initial encounter: Secondary | ICD-10-CM | POA: Insufficient documentation

## 2018-01-26 DIAGNOSIS — Y939 Activity, unspecified: Secondary | ICD-10-CM | POA: Insufficient documentation

## 2018-01-26 DIAGNOSIS — S0990XA Unspecified injury of head, initial encounter: Secondary | ICD-10-CM | POA: Insufficient documentation

## 2018-01-26 DIAGNOSIS — Y929 Unspecified place or not applicable: Secondary | ICD-10-CM | POA: Insufficient documentation

## 2018-01-26 NOTE — ED Provider Notes (Signed)
[facility]-EMERGENCY DEPT Provider Note   CSN: [id number] Arrival date & time: 01/26/18  0715     History   Chief Complaint Chief Complaint  Patient presents with  . Fall  . Headache    HPI Regina Reeves is a 22 y.o. female.  Patient is a 22 year old female who presents after a fall.  She tripped and fell, striking her head on a door frame.  There is no loss of consciousness.  She has soreness to the area and her forehead and the surrounding headache in her forehead area only.  She has no nausea or vomiting.  No gait instability.  No vision changes.  No confusion.  She denies any other injuries from the fall.  She is not on anticoagulants.     Past Medical History:  Diagnosis Date  . H/O measles   . H/O varicella   . Irregular periods/menstrual cycles   . STD (sexually transmitted disease) 2015   CG    Patient Active Problem List   Diagnosis Date Noted  . Visit for routine gyn exam 08/17/2016  . Possible exposure to STD 08/17/2016    Past Surgical History:  Procedure Laterality Date  . NO PAST SURGERIES       OB History    Gravida  2   Para  1   Term  1   Preterm      AB      Living  1     SAB      TAB      Ectopic      Multiple      Live Births  1            Home Medications    Prior to Admission medications   Medication Sig Start Date End Date Taking? Authorizing Provider  acetaminophen (TYLENOL) 325 MG tablet Take 650 mg by mouth every 6 (six) hours as needed for moderate pain.    [provider]  cyclobenzaprine (FLEXERIL) 10 MG tablet Take 1 tablet (10 mg total) by mouth 3 (three) times daily as needed for muscle spasms. 07/06/17   Triplett, Rulon Eisenmenger B, FNP  HYDROcodone-acetaminophen (NORCO/VICODIN) 5-325 MG tablet Take 1 tablet by mouth every 4 (four) hours as needed for moderate pain. 07/06/17 07/06/18  Triplett, Rulon Eisenmenger B, FNP  polyethylene glycol (MIRALAX) packet Take 17 g by mouth daily. 09/30/17    Aviva Signs, CNM  promethazine (PHENERGAN) 25 MG tablet Take 1 tablet (25 mg total) by mouth every 6 (six) hours as needed for nausea or vomiting. 09/30/17   Aviva Signs, CNM    Family History Family History  Problem Relation Age of Onset  . Cancer Paternal Grandfather     Social History Social History   Tobacco Use  . Smoking status: Never Smoker  . Smokeless tobacco: Never Used  Substance Use Topics  . Alcohol use: No    Frequency: Never  . Drug use: No     Allergies   Patient has no known allergies.   Review of Systems Review of Systems  Constitutional: Negative for activity change, appetite change and fever.  HENT: Negative for dental problem, nosebleeds and trouble swallowing.   Eyes: Negative for pain and visual disturbance.  Respiratory: Negative for shortness of breath.   Cardiovascular: Negative for chest pain.  Gastrointestinal: Negative for abdominal pain, nausea and vomiting.  Genitourinary: Negative for dysuria and hematuria.  Musculoskeletal: Negative for arthralgias, back pain, joint swelling and neck pain.  Skin: Negative for wound.  Neurological: Positive for headaches. Negative for weakness and numbness.  Psychiatric/Behavioral: Negative for confusion.     Physical Exam Updated Vital Signs BP 123/87 (BP Location: Left Arm)   Pulse 90   Temp 98.4 F (36.9 C) (Oral)   Resp 14   Ht 5\' 4"  (1.626 m)   Wt 68.3 kg (150 lb 8 oz)   LMP 08/10/2017   SpO2 99%   Breastfeeding? Unknown   BMI 25.83 kg/m   Physical Exam  Constitutional: She is oriented to person, place, and time. She appears well-developed and well-nourished.  HENT:  Head: Normocephalic.  Nose: Nose normal.  No hemotympanum, she has a small 1 to 2 cm hematoma to her right forehead.  There is no eye involvement.  No overlying lacerations or abrasions.  No bony deficits.  Eyes: Pupils are equal, round, and reactive to light. Conjunctivae are normal.  Neck:  No pain to the  cervical, thoracic, or LS spine.  No step-offs or deformities noted  Cardiovascular: Normal rate and regular rhythm.  No murmur heard. Pulmonary/Chest: Effort normal and breath sounds normal. No respiratory distress. She has no wheezes. She exhibits no tenderness.  Abdominal: Soft. Bowel sounds are normal. She exhibits no distension. There is no tenderness.  Musculoskeletal: Normal range of motion.  No pain on palpation or ROM of the extremities  Neurological: She is alert and oriented to person, place, and time. She has normal strength. She is not disoriented. No cranial nerve deficit or sensory deficit. Coordination normal. GCS eye subscore is 4. GCS verbal subscore is 5. GCS motor subscore is 6.  Gait normal  Skin: Skin is warm and dry. Capillary refill takes less than 2 seconds.  Psychiatric: She has a normal mood and affect.  Vitals reviewed.    ED Treatments / Results  Labs (all labs ordered are listed, but only abnormal results are displayed) Labs Reviewed - No data to display  EKG None  Radiology No results found.  Procedures Procedures (including critical care time)  Medications Ordered in ED Medications - No data to display   Initial Impression / Assessment and Plan / ED Course  I have reviewed the triage vital signs and the nursing notes.  Pertinent labs & imaging results that were available during my care of the patient were reviewed by me and considered in my medical decision making (see chart for details).     Patient presents with a small hematoma to her forehead after minor head injury.  She does not have any neurologic deficits or vomiting.  There is no indication for imaging at this point.  There is no suspicion of a skull fracture.  She was discharged home in good condition.  She was given head injury precautions.  Return precautions were given.  Final Clinical Impressions(s) / ED Diagnoses   Final diagnoses:  Injury of head, initial encounter    ED  Discharge Orders    None       Rolan BuccoBelfi, Riaan [place], MD 01/26/18 [phone]

## 2018-01-26 NOTE — ED Notes (Signed)
Pt asked this nurse at discharge if she could take her discharge papers to the police station.  Pt was tearful and would not inform this nurse about what happened this morning to cause her injury.

## 2018-01-26 NOTE — ED Triage Notes (Signed)
Patient reports that she ripped and fell hitting her head on the ground. Patient c/o headache at the forehead area. patient denies any blurred vision, N/v.

## 2018-08-09 ENCOUNTER — Encounter (HOSPITAL_COMMUNITY): Payer: Self-pay | Admitting: Emergency Medicine

## 2018-08-09 ENCOUNTER — Emergency Department (HOSPITAL_COMMUNITY): Payer: Medicaid Other

## 2018-08-09 ENCOUNTER — Emergency Department (HOSPITAL_COMMUNITY)
Admission: EM | Admit: 2018-08-09 | Discharge: 2018-08-09 | Disposition: A | Discharge status: Home / Self Care | Payer: Medicaid Other | Attending: Emergency Medicine | Admitting: Emergency Medicine

## 2018-08-09 DIAGNOSIS — J111 Influenza due to unidentified influenza virus with other respiratory manifestations: Secondary | ICD-10-CM | POA: Insufficient documentation

## 2018-08-09 DIAGNOSIS — R69 Illness, unspecified: Secondary | ICD-10-CM

## 2018-08-09 LAB — RESPIRATORY PANEL BY PCR
Adenovirus: NOT DETECTED
Bordetella pertussis: NOT DETECTED
CHLAMYDOPHILA PNEUMONIAE-RVPPCR: NOT DETECTED
CORONAVIRUS OC43-RVPPCR: NOT DETECTED
Coronavirus 229E: NOT DETECTED
Coronavirus HKU1: NOT DETECTED
Coronavirus NL63: NOT DETECTED
INFLUENZA A-RVPPCR: NOT DETECTED
INFLUENZA B-RVPPCR: DETECTED — AB
MYCOPLASMA PNEUMONIAE-RVPPCR: NOT DETECTED
Metapneumovirus: NOT DETECTED
PARAINFLUENZA VIRUS 3-RVPPCR: NOT DETECTED
PARAINFLUENZA VIRUS 4-RVPPCR: NOT DETECTED
Parainfluenza Virus 1: NOT DETECTED
Parainfluenza Virus 2: NOT DETECTED
RESPIRATORY SYNCYTIAL VIRUS-RVPPCR: NOT DETECTED
Rhinovirus / Enterovirus: NOT DETECTED

## 2018-08-09 LAB — D-DIMER, QUANTITATIVE: D-Dimer, Quant: 0.3 ug/mL-FEU (ref 0.00–0.50)

## 2018-08-09 LAB — CBC
HCT: 39.7 % (ref 36.0–46.0)
Hemoglobin: 12.5 g/dL (ref 12.0–15.0)
MCH: 27.2 pg (ref 26.0–34.0)
MCHC: 31.5 g/dL (ref 30.0–36.0)
MCV: 86.3 fL (ref 80.0–100.0)
NRBC: 0 % (ref 0.0–0.2)
PLATELETS: 269 10*3/uL (ref 150–400)
RBC: 4.6 MIL/uL (ref 3.87–5.11)
RDW: 13.8 % (ref 11.5–15.5)
WBC: 5.9 10*3/uL (ref 4.0–10.5)

## 2018-08-09 LAB — BASIC METABOLIC PANEL
Anion gap: 7 (ref 5–15)
BUN: 9 mg/dL (ref 6–20)
CHLORIDE: 105 mmol/L (ref 98–111)
CO2: 26 mmol/L (ref 22–32)
CREATININE: 0.74 mg/dL (ref 0.44–1.00)
Calcium: 9.4 mg/dL (ref 8.9–10.3)
Glucose, Bld: 94 mg/dL (ref 70–99)
Potassium: 3.5 mmol/L (ref 3.5–5.1)
SODIUM: 138 mmol/L (ref 135–145)

## 2018-08-09 LAB — I-STAT BETA HCG BLOOD, ED (NOT ORDERABLE)

## 2018-08-09 LAB — POCT I-STAT TROPONIN I: TROPONIN I, POC: 0 ng/mL (ref 0.00–0.08)

## 2018-08-09 MED ORDER — ONDANSETRON 4 MG PO TBDP: 4.0000 mg | ORAL_TABLET | Freq: Three times a day (TID) | ORAL | 0 refills | Status: AC | PRN

## 2018-08-09 MED ORDER — IBUPROFEN 200 MG PO TABS
600.0000 mg | ORAL_TABLET | Freq: Once | ORAL | Status: DC
  Administered 2018-08-09: 600 mg via ORAL
  Filled 2018-08-09: qty 3

## 2018-08-09 MED ORDER — IBUPROFEN 100 MG/5ML PO SUSP
600.0000 mg | Freq: Once | ORAL | Status: DC
  Filled 2018-08-09: qty 30

## 2018-08-09 MED ORDER — SODIUM CHLORIDE 0.9 % IV BOLUS
1000.0000 mL | Freq: Once | INTRAVENOUS | Status: AC
  Administered 2018-08-09: 1000 mL via INTRAVENOUS

## 2018-08-09 MED ORDER — ACETAMINOPHEN 325 MG PO TABS
650.0000 mg | ORAL_TABLET | Freq: Once | ORAL | Status: AC | PRN
  Administered 2018-08-09: 650 mg via ORAL
  Filled 2018-08-09: qty 2

## 2018-08-09 NOTE — ED Notes (Signed)
Provided patient with ginger ale and crackers. No other needs at this time.

## 2018-08-09 NOTE — ED Notes (Signed)
Patient states "I have been here all day and no one has done anything for me but this IV." Attempted to administer ibuprofen and patient states "I cannot take pills because I cannot swallow them, I have to chew them." Encouraged patient to attempt to take medication, but patient refuses.  Medication is at bedside with water for patient to take. Patient is crying and states that her entire body aches and it is hard for her to breathe. Patient is breathing 20-22 breaths per minutes and SpO2 is 100% on RA, but patient is breathing shallow breaths, so encouraged patient to take deep breaths. Will continues to monitor patient and notify EDP about medication issues.

## 2018-08-09 NOTE — ED Notes (Addendum)
Per [facility], she has spoken to the Pt.  Pt unhappy about wait.  Pt has been provided Department Director's number and Patient Experience's number.  Also, Pt was upset ED staff attempted to recheck her vital signs.  When [facility] attempted to explain the appropriateness of this action, the Pt hung up the phone.  Per AC, Pt easily speaking full sentences and upset, but in no acute distress.

## 2018-08-09 NOTE — ED Provider Notes (Signed)
[facility]-EMERGENCY DEPT Provider Note   CSN: [id number] Arrival date & time: 08/09/18  1311     History   Chief Complaint Chief Complaint  Patient presents with  . Cough  . Chest Pain  . Fever    HPI Regina Reeves is a 23 y.o. female who presents today for evaluation of productive cough since Saturday.  She reports that she has had intermittent left-sided central chest pains and muscle spasms in her right thigh.  Her cough is productive.  She denies nasal congestion.  She did not get a flu shot this year.  She reports generalized body aches and feeling unwell.  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  She denies N/V/D.    She denies abnormal vaginal discharge or bleeding.  No increased frequency or urgency.  She reports that her abdomen does hurt, however it is not any more than the rest of her body.    HPI  Past Medical History:  Diagnosis Date  . H/O measles   . H/O varicella   . Irregular periods/menstrual cycles   . STD (sexually transmitted disease) 2015   CG    Patient Active Problem List   Diagnosis Date Noted  . Visit for routine gyn exam 08/17/2016  . Possible exposure to STD 08/17/2016    Past Surgical History:  Procedure Laterality Date  . NO PAST SURGERIES       OB History    Gravida  2   Para  1   Term  1   Preterm      AB      Living  1     SAB      TAB      Ectopic      Multiple      Live Births  1            Home Medications    Prior to Admission medications   Medication Sig Start Date End Date Taking? Authorizing Provider  acetaminophen (TYLENOL) 325 MG tablet Take 650 mg by mouth every 6 (six) hours as needed for moderate pain.   Yes [provider]  ondansetron (ZOFRAN ODT) 4 MG disintegrating tablet Take 1 tablet (4 mg total) by mouth every 8 (eight) hours as needed for nausea or vomiting. 08/09/18   Cristina Gong, PA-C    Family History Family  History  Problem Relation Age of Onset  . Cancer Paternal Grandfather     Social History Social History   Tobacco Use  . Smoking status: Never Smoker  . Smokeless tobacco: Never Used  Substance Use Topics  . Alcohol use: No    Frequency: Never  . Drug use: No     Allergies   Patient has no known allergies.   Review of Systems Review of Systems  Constitutional: Positive for chills, fatigue and fever.  HENT: Negative for congestion, ear pain, sinus pressure, sinus pain and tinnitus.   Eyes: Negative for visual disturbance.  Respiratory: Positive for cough, chest tightness and shortness of breath.   Cardiovascular: Positive for chest pain.  Gastrointestinal: Positive for abdominal pain. Negative for diarrhea, nausea and vomiting.  Genitourinary: Negative for frequency, pelvic pain, urgency, vaginal discharge and vaginal pain.  Musculoskeletal: Positive for myalgias. Negative for back pain and neck pain.  Skin: Negative for color change, rash and wound.  Neurological: Negative for weakness and headaches.  Psychiatric/Behavioral: Negative for confusion.  All other systems reviewed and  are negative.    Physical Exam Updated Vital Signs BP 116/79 (BP Location: Left Arm)   Pulse 91   Temp (!) 97.4 F (36.3 C) (Axillary)   Resp 17   Ht 5\' 4"  (1.626 m)   Wt 72.6 kg   LMP 07/10/2018 (Approximate)   SpO2 100%   BMI 27.46 kg/m   Physical Exam Vitals signs and nursing note reviewed.  Constitutional:      General: She is not in acute distress.    Appearance: She is well-developed. She is not toxic-appearing or diaphoretic.  HENT:     Head: Normocephalic and atraumatic.     Right Ear: External ear normal.     Left Ear: External ear normal.     Nose: Nose normal.  Eyes:     General: No scleral icterus.       Right eye: No discharge.        Left eye: No discharge.     Conjunctiva/sclera: Conjunctivae normal.     Pupils: Pupils are equal, round, and reactive to light.   Neck:     Musculoskeletal: Normal range of motion and neck supple.     Trachea: No tracheal deviation.  Cardiovascular:     Rate and Rhythm: Regular rhythm. Tachycardia present.     Heart sounds: Normal heart sounds. No murmur. No friction rub. No gallop.   Pulmonary:     Effort: Pulmonary effort is normal. No respiratory distress.     Breath sounds: Normal breath sounds. No stridor. No decreased breath sounds, wheezing or rhonchi.  Chest:     Chest wall: No tenderness.  Abdominal:     General: Bowel sounds are normal. There is no distension.     Palpations: Abdomen is soft.     Tenderness: There is no abdominal tenderness. There is no guarding.  Musculoskeletal: Normal range of motion.        General: No deformity.     Right lower leg: She exhibits no tenderness. No edema.     Left lower leg: She exhibits no tenderness. No edema.  Skin:    General: Skin is warm and dry.  Neurological:     Mental Status: She is alert and oriented to person, place, and time.     Cranial Nerves: No cranial nerve deficit.     Sensory: No sensory deficit.     Motor: No abnormal muscle tone.  Psychiatric:        Mood and Affect: Mood normal.        Behavior: Behavior normal.      ED Treatments / Results  Labs (all labs ordered are listed, but only abnormal results are displayed) Labs Reviewed  RESPIRATORY PANEL BY PCR - Abnormal; Notable for the following components:      Result Value   Influenza B DETECTED (*)    All other components within normal limits  BASIC METABOLIC PANEL  CBC  D-DIMER, QUANTITATIVE (NOT [facility])  I-STAT TROPONIN, ED  I-STAT BETA HCG BLOOD, ED ([facility], [facility], [facility] ONLY)  POCT I-STAT TROPONIN I  I-STAT BETA HCG BLOOD, ED (NOT ORDERABLE)    EKG EKG Interpretation  Date/Time:  Tuesday August 09 2018 13:24:26 EST Ventricular Rate:  111 PR Interval:    QRS Duration: 79 QT Interval:  293 QTC Calculation: 399 R Axis:   63 Text Interpretation:  Sinus tachycardia Atrial  premature complex RSR' in V1 or V2, right VCD or RVH Borderline T abnormalities, anterior leads No old tracing to compare  Confirmed by Shaune PollackIsaacs, Cameron [phone]([id number]) on 08/09/2018 5:46:07 PM   Radiology Dg Chest 2 View  Result Date: 08/09/2018 CLINICAL DATA:  Pt complains of productive cough since the weekend with intermittent center to left chest pains. Having muscle spasms in legs today. Reports smokes vape. EXAM: CHEST - 2 VIEW COMPARISON:  05/23/2017 FINDINGS: Lungs are clear. Heart size and mediastinal contours are within normal limits. No effusion.  No pneumothorax. Visualized bones unremarkable. IMPRESSION: No acute cardiopulmonary disease. Electronically Signed   By: Corlis Leak  Hassell M.D.   On: 08/09/2018 15:20    Procedures Procedures (including critical care time)  Medications Ordered in ED Medications  ibuprofen (ADVIL,MOTRIN) 100 MG/5ML suspension 600 mg (600 mg Oral Not Given 08/09/18 2120)  acetaminophen (TYLENOL) tablet 650 mg (650 mg Oral Given 08/09/18 1327)  sodium chloride 0.9 % bolus 1,000 mL (0 mLs Intravenous Stopped 08/09/18 2207)     Initial Impression / Assessment and Plan / ED Course  I have reviewed the triage vital signs and the nursing notes.  Pertinent labs & imaging results that were available during my care of the patient were reviewed by me and considered in my medical decision making (see chart for details).  Clinical Course as of Aug 09 2346  Tue Aug 09, 2018  2210 Patient reevaluated, she says that she is feeling much better and is ready to go home.  Lungs remain clear to auscultation bilaterally.  She has p.o. challenged without difficulty.   [EH]    Clinical Course User Index [EH] Cristina GongHammond, Rocsi Hazelbaker W, PA-C   Patient presents today for evaluation of cough, chest pain, and fever for 3 days.  Upon arrival she was febrile and tachycardic.  EKG was obtained given report of chest pain showing concern for RVH/raising concern for PE given tachycardia.  D-dimer was not  elevated. Once her fever was treated with ibuprofen and tylenol her HR improved.  Troponin is not elevated.  She is not pregnant.  She does not have a significant leukocytosis, is not anemic.  Her BMP does not show any significant electrolyte derangements.  Chest x-ray without acute abnormalities.  Suspect that she may have influenza.  Flu swab was sent.  Discussed with patient that she is outside the 24 to 48-hour recommended initiation for Tamiflu. discussed the cost versus benefit of Tamiflu treatment with the patient.  Patient will be discharged with instructions to orally hydrate, rest, and use over-the-counter medications   Return precautions were discussed with patient who states their understanding.  At the time of discharge patient denied any unaddressed complaints or concerns.  Patient is agreeable for discharge home.   Final Clinical Impressions(s) / ED Diagnoses   Final diagnoses:  Influenza-like illness    ED Discharge Orders         Ordered    ondansetron (ZOFRAN ODT) 4 MG disintegrating tablet  Every 8 hours PRN     08/09/18 2212           Cristina GongHammond, Cheryal Salas W, PA-C 08/10/18 0000    Shaune PollackIsaacs, Cameron, MD 08/10/18 [phone]

## 2018-08-09 NOTE — ED Notes (Signed)
Pt refused vital signs.  Pt stated that her pain was still severe and she did not need her vital signs taken.  Informed RN.

## 2018-08-09 NOTE — ED Triage Notes (Signed)
Pt c/o productive cough since the weekend with intermittent center to left chest pains. Having muscle spasms in legs today. Reports smokes vape.

## 2018-08-09 NOTE — Discharge Instructions (Signed)
Today your symptoms are consistent with a flu or a flulike illness.   I have given you information to read on the flu as most of this still applies.  If you have not already, please consider getting a flu shot once you are well.  Please make sure to practice good hand hygiene to help prevent the spread of flu.  We discussed today that many symptoms of the flu such as fever, not feeling well, and body aches can also be the first signs of more serious illnesses or infections.  If you have any new or worsening symptoms please seek additional medical care and evaluation.    Please take Ibuprofen (Advil, motrin) and Tylenol (acetaminophen) to relieve your pain.  You may take up to 600 MG (3 pills) of normal strength ibuprofen every 8 hours as needed.  In between doses of ibuprofen you make take tylenol, up to 1,000 mg (two extra strength pills).  Do not take more than 3,000 mg tylenol in a 24 hour period.  Please check all medication labels as many medications such as pain and cold medications may contain tylenol.  Do not drink alcohol while taking these medications.  Do not take other NSAID'S while taking ibuprofen (such as aleve or naproxen).  Please take ibuprofen with food to decrease stomach upset.

## 2018-08-09 NOTE — ED Notes (Signed)
Did not give liquid ibuprofen because patient decided to take the ibuprofen tabs left at bedside. Patient had 600 mg of ibuprofen.

## 2018-08-16 ENCOUNTER — Emergency Department (HOSPITAL_COMMUNITY)
Admission: EM | Admit: 2018-08-16 | Discharge: 2018-08-16 | Disposition: A | Discharge status: Home / Self Care | Payer: Medicaid Other | Attending: Emergency Medicine | Admitting: Emergency Medicine

## 2018-08-16 ENCOUNTER — Other Ambulatory Visit: Payer: Self-pay

## 2018-08-16 ENCOUNTER — Encounter (HOSPITAL_COMMUNITY): Payer: Self-pay

## 2018-08-16 DIAGNOSIS — E876 Hypokalemia: Secondary | ICD-10-CM | POA: Insufficient documentation

## 2018-08-16 DIAGNOSIS — Z79899 Other long term (current) drug therapy: Secondary | ICD-10-CM | POA: Insufficient documentation

## 2018-08-16 DIAGNOSIS — R110 Nausea: Secondary | ICD-10-CM

## 2018-08-16 LAB — URINALYSIS, ROUTINE W REFLEX MICROSCOPIC
Bilirubin Urine: NEGATIVE
Glucose, UA: NEGATIVE mg/dL
Ketones, ur: 20 mg/dL — AB
Leukocytes, UA: NEGATIVE
Nitrite: NEGATIVE
Protein, ur: 30 mg/dL — AB
SPECIFIC GRAVITY, URINE: 1.023 (ref 1.005–1.030)
pH: 6 (ref 5.0–8.0)

## 2018-08-16 LAB — CBC
HCT: 42.9 % (ref 36.0–46.0)
HEMOGLOBIN: 13.7 g/dL (ref 12.0–15.0)
MCH: 27.1 pg (ref 26.0–34.0)
MCHC: 31.9 g/dL (ref 30.0–36.0)
MCV: 84.8 fL (ref 80.0–100.0)
Platelets: 219 10*3/uL (ref 150–400)
RBC: 5.06 MIL/uL (ref 3.87–5.11)
RDW: 13.3 % (ref 11.5–15.5)
WBC: 3.6 10*3/uL — ABNORMAL LOW (ref 4.0–10.5)
nRBC: 0 % (ref 0.0–0.2)

## 2018-08-16 LAB — I-STAT BETA HCG BLOOD, ED (MC, WL, AP ONLY): I-stat hCG, quantitative: <5 m[IU]/mL (ref <5)

## 2018-08-16 LAB — COMPREHENSIVE METABOLIC PANEL
ALT: 24 U/L (ref 0–44)
ANION GAP: 10 (ref 5–15)
AST: 20 U/L (ref 15–41)
Albumin: 4.2 g/dL (ref 3.5–5.0)
Alkaline Phosphatase: 46 U/L (ref 38–126)
BUN: 8 mg/dL (ref 6–20)
CO2: 25 mmol/L (ref 22–32)
Calcium: 9 mg/dL (ref 8.9–10.3)
Chloride: 102 mmol/L (ref 98–111)
Creatinine, Ser: 0.72 mg/dL (ref 0.44–1.00)
GFR calc non Af Amer: >60 mL/min (ref >60)
Glucose, Bld: 87 mg/dL (ref 70–99)
Potassium: 3.2 mmol/L — ABNORMAL LOW (ref 3.5–5.1)
Sodium: 137 mmol/L (ref 135–145)
Total Bilirubin: 1.1 mg/dL (ref 0.3–1.2)
Total Protein: 7.9 g/dL (ref 6.5–8.1)

## 2018-08-16 LAB — LIPASE, BLOOD: Lipase: 28 U/L (ref 11–51)

## 2018-08-16 MED ORDER — PROMETHAZINE HCL 25 MG PO TABS: 25.0000 mg | ORAL_TABLET | Freq: Four times a day (QID) | ORAL | 0 refills | Status: AC | PRN

## 2018-08-16 MED ORDER — ONDANSETRON 4 MG PO TBDP
4.0000 mg | ORAL_TABLET | Freq: Once | ORAL | Status: AC
  Administered 2018-08-16: 4 mg via ORAL
  Filled 2018-08-16: qty 1

## 2018-08-16 MED ORDER — SODIUM CHLORIDE 0.9% FLUSH: 3.0000 mL | Freq: Once | INTRAVENOUS | Status: DC

## 2018-08-16 MED ORDER — POTASSIUM CHLORIDE CRYS ER 20 MEQ PO TBCR
40.0000 meq | EXTENDED_RELEASE_TABLET | Freq: Once | ORAL | Status: AC
  Administered 2018-08-16: 40 meq via ORAL
  Filled 2018-08-16: qty 2

## 2018-08-16 NOTE — ED Provider Notes (Signed)
[facility]-EMERGENCY DEPT Provider Note   CSN: [id number] Arrival date & time: 08/16/18  4098     History   Chief Complaint Chief Complaint  Patient presents with  . Emesis    HPI Regina Reeves is a 23 y.o. female.  HPI  23 yo female complaining of nausea for one week.  States seen here and diagnosed with flu. She states she is only taking water.  Patient did have fever but this and other ili like symptoms have resolved.  She states this happened one time before- several years ago when she was pregnant, but states took pregnancy test last week and was negative. LMP -patient on depo and last injection November and does not have regular periods. Patient was positive for influenza b last week.   Past Medical History:  Diagnosis Date  . H/O measles   . H/O varicella   . Irregular periods/menstrual cycles   . STD (sexually transmitted disease) 2015   CG    Patient Active Problem List   Diagnosis Date Noted  . Visit for routine gyn exam 08/17/2016  . Possible exposure to STD 08/17/2016    Past Surgical History:  Procedure Laterality Date  . NO PAST SURGERIES       OB History    Gravida  2   Para  1   Term  1   Preterm      AB      Living  1     SAB      TAB      Ectopic      Multiple      Live Births  1            Home Medications    Prior to Admission medications   Medication Sig Start Date End Date Taking? Authorizing Provider  acetaminophen (TYLENOL) 325 MG tablet Take 650 mg by mouth every 6 (six) hours as needed for moderate pain.    [provider]  ondansetron (ZOFRAN ODT) 4 MG disintegrating tablet Take 1 tablet (4 mg total) by mouth every 8 (eight) hours as needed for nausea or vomiting. 08/09/18   Cristina Gong, PA-C    Family History Family History  Problem Relation Age of Onset  . Cancer Paternal Grandfather     Social History Social History   Tobacco Use  . Smoking status: Never  Smoker  . Smokeless tobacco: Never Used  Substance Use Topics  . Alcohol use: No    Frequency: Never  . Drug use: No     Allergies   Patient has no known allergies.   Review of Systems Review of Systems  Constitutional: Positive for appetite change.  HENT: Negative.   Eyes: Negative.   Respiratory: Positive for cough.   Cardiovascular: Negative.   Gastrointestinal: Positive for diarrhea.  Endocrine: Negative.   Genitourinary: Negative.   Musculoskeletal: Negative.   Allergic/Immunologic: Negative.   Neurological: Negative.   Hematological: Negative.   Psychiatric/Behavioral: Negative.   All other systems reviewed and are negative.    Physical Exam Updated Vital Signs BP 107/84   Pulse 89   Temp 98.5 F (36.9 C) (Oral)   Resp 16   Ht 1.626 m (5\' 4" )   Wt 69 kg   SpO2 100%   BMI 26.12 kg/m   Physical Exam Vitals signs and nursing note reviewed.  Constitutional:      Appearance: She is well-developed.  HENT:     Head: Normocephalic and atraumatic.  Right Ear: External ear normal.     Left Ear: External ear normal.     Nose: Nose normal.  Eyes:     Conjunctiva/sclera: Conjunctivae normal.     Pupils: Pupils are equal, round, and reactive to light.  Neck:     Musculoskeletal: Normal range of motion and neck supple.     Thyroid: No thyromegaly.     Vascular: No JVD.     Trachea: No tracheal deviation.  Cardiovascular:     Rate and Rhythm: Normal rate and regular rhythm.     Heart sounds: Normal heart sounds.  Pulmonary:     Effort: Pulmonary effort is normal.     Breath sounds: Normal breath sounds. No wheezing.  Abdominal:     General: Bowel sounds are normal.     Palpations: Abdomen is soft. There is no mass.     Tenderness: There is no abdominal tenderness. There is no guarding.  Musculoskeletal: Normal range of motion.  Lymphadenopathy:     Cervical: No cervical adenopathy.  Skin:    General: Skin is warm and dry.  Neurological:      Mental Status: She is alert and oriented to person, place, and time.     GCS: GCS eye subscore is 4. GCS verbal subscore is 5. GCS motor subscore is 6.     Cranial Nerves: No cranial nerve deficit.     Sensory: No sensory deficit.     Gait: Gait normal.     Deep Tendon Reflexes: Reflexes are normal and symmetric. Babinski sign absent on the right side. Babinski sign absent on the left side.     Reflex Scores:      Bicep reflexes are 2+ on the right side and 2+ on the left side.      Patellar reflexes are 2+ on the right side and 2+ on the left side.    Comments: Strength is normal and equal throughout. Cranial nerves grossly intact. Patient fluent. No gross ataxia and patient able to ambulate without difficulty.  Psychiatric:        Behavior: Behavior normal.        Thought Content: Thought content normal.        Judgment: Judgment normal.      ED Treatments / Results  Labs (all labs ordered are listed, but only abnormal results are displayed) Labs Reviewed  LIPASE, BLOOD  COMPREHENSIVE METABOLIC PANEL  CBC  URINALYSIS, ROUTINE W REFLEX MICROSCOPIC  I-STAT BETA HCG BLOOD, ED ([facility], [facility], [facility] ONLY)    EKG None  Radiology No results found.  Procedures Procedures (including critical care time)  Medications Ordered in ED Medications  sodium chloride flush (NS) 0.9 % injection 3 mL (has no administration in time range)     Initial Impression / Assessment and Plan / ED Course  I have reviewed the triage vital signs and the nursing notes.  Pertinent labs & imaging results that were available during my care of the patient were reviewed by me and considered in my medical decision making (see chart for details).   Patient presents today complaining of decreased p.o. intake for the past week due to no appetite.  Here her labs are obtained and she is mildly hypokalemic.  This is being repleted.  She has tolerated ginger ale here without difficulty.  She is advised to use her Zofran  if she is vomiting, otherwise she is advised to drink and gradually advance her diet as tolerated.    Final Clinical Impressions(s) /  ED Diagnoses   Final diagnoses:  Nausea  Hypokalemia    ED Discharge Orders    None       Margarita Grizzleay, Rayleigh Gillyard, MD 08/16/18 1237

## 2018-08-16 NOTE — ED Notes (Signed)
GINGERALE GIVEN.

## 2018-08-16 NOTE — ED Notes (Signed)
TOLERATING PO CHALLENGE

## 2018-08-16 NOTE — ED Notes (Signed)
PO TOLERATING. CRACKERS GIVEN

## 2018-08-16 NOTE — ED Notes (Signed)
PICK UP RX 

## 2018-08-16 NOTE — Discharge Instructions (Addendum)
Please use zofran or phenergan for nausea and advance diet gradually from bland foods to regular diet.  REcheck with your doctor in 1-2 weeks if not improved.  REturn to ED if worse at any time especially pain or fever.

## 2018-08-16 NOTE — ED Triage Notes (Signed)
Patient states she was diagnosed with the flu last week and states she can not keep food down. Patient states she is able to drink fluids,but when she eats food she starats vomiting approx 2 hours later.
